# Patient Record
Sex: Female | Born: 1935 | Race: White | Hispanic: No | Smoking: Never smoker
Health system: Southern US, Community
[De-identification: ages and names within clinical notes are randomized; demographics above are authoritative.]

## PROBLEM LIST (undated history)

## (undated) DIAGNOSIS — M1990 Unspecified osteoarthritis, unspecified site: Secondary | ICD-10-CM

## (undated) DIAGNOSIS — E119 Type 2 diabetes mellitus without complications: Secondary | ICD-10-CM

## (undated) DIAGNOSIS — K219 Gastro-esophageal reflux disease without esophagitis: Secondary | ICD-10-CM

## (undated) DIAGNOSIS — F419 Anxiety disorder, unspecified: Secondary | ICD-10-CM

## (undated) DIAGNOSIS — R011 Cardiac murmur, unspecified: Secondary | ICD-10-CM

## (undated) DIAGNOSIS — G629 Polyneuropathy, unspecified: Secondary | ICD-10-CM

## (undated) DIAGNOSIS — I10 Essential (primary) hypertension: Secondary | ICD-10-CM

## (undated) HISTORY — PX: CHOLECYSTECTOMY: SHX55

## (undated) HISTORY — PX: ABDOMINAL HYSTERECTOMY: SHX81

## (undated) HISTORY — PX: COLONOSCOPY WITH ESOPHAGOGASTRODUODENOSCOPY (EGD): SHX5779

## (undated) HISTORY — PX: SHOULDER ARTHROSCOPY: SHX128

## (undated) HISTORY — PX: CARPAL TUNNEL RELEASE: SHX101

---

## 2012-02-22 ENCOUNTER — Ambulatory Visit: Payer: Self-pay

## 2012-02-22 LAB — URINALYSIS, COMPLETE
Blood: NEGATIVE
Glucose,UR: NEGATIVE mg/dL (ref 0–75)
Ketone: NEGATIVE
Protein: NEGATIVE
RBC,UR: NONE SEEN /HPF (ref 0–5)
Specific Gravity: 1.005 (ref 1.003–1.030)

## 2013-02-16 ENCOUNTER — Emergency Department: Payer: Self-pay | Admitting: Emergency Medicine

## 2013-02-16 LAB — CBC
HGB: 14.4 g/dL (ref 12.0–16.0)
MCH: 30.2 pg (ref 26.0–34.0)
MCHC: 34.4 g/dL (ref 32.0–36.0)
MCV: 88 fL (ref 80–100)
Platelet: 200 10*3/uL (ref 150–440)
RDW: 12.8 % (ref 11.5–14.5)

## 2013-02-16 LAB — COMPREHENSIVE METABOLIC PANEL
Alkaline Phosphatase: 67 U/L (ref 50–136)
Anion Gap: 7 (ref 7–16)
BUN: 17 mg/dL (ref 7–18)
Bilirubin,Total: 0.8 mg/dL (ref 0.2–1.0)
Calcium, Total: 9.2 mg/dL (ref 8.5–10.1)
Chloride: 102 mmol/L (ref 98–107)
Co2: 25 mmol/L (ref 21–32)
Creatinine: 0.7 mg/dL (ref 0.60–1.30)
EGFR (Non-African Amer.): >60
Osmolality: 271 (ref 275–301)
Potassium: 4 mmol/L (ref 3.5–5.1)
Sodium: 134 mmol/L — ABNORMAL LOW (ref 136–145)

## 2013-02-16 LAB — CK TOTAL AND CKMB (NOT AT ARMC): CK-MB: 0.8 ng/mL (ref 0.5–3.6)

## 2013-02-16 LAB — TROPONIN I: Troponin-I: <0.02 ng/mL

## 2013-02-18 ENCOUNTER — Emergency Department: Payer: Self-pay | Admitting: Unknown Physician Specialty

## 2013-02-18 LAB — URINALYSIS, COMPLETE
Bilirubin,UR: NEGATIVE
Blood: NEGATIVE
Glucose,UR: 50 mg/dL (ref 0–75)
Ketone: NEGATIVE
Nitrite: NEGATIVE
Ph: 5 (ref 4.5–8.0)
Protein: NEGATIVE
RBC,UR: 1 /HPF (ref 0–5)
Specific Gravity: 1.012 (ref 1.003–1.030)
Squamous Epithelial: 6
WBC UR: 3 /HPF (ref 0–5)

## 2013-02-18 LAB — CBC
HGB: 14.9 g/dL (ref 12.0–16.0)
MCH: 29.6 pg (ref 26.0–34.0)
Platelet: 218 10*3/uL (ref 150–440)
RBC: 5.03 10*6/uL (ref 3.80–5.20)
RDW: 12.6 % (ref 11.5–14.5)
WBC: 7.5 10*3/uL (ref 3.6–11.0)

## 2013-02-18 LAB — COMPREHENSIVE METABOLIC PANEL
Anion Gap: 6 — ABNORMAL LOW (ref 7–16)
Chloride: 99 mmol/L (ref 98–107)
Co2: 28 mmol/L (ref 21–32)
Creatinine: 0.97 mg/dL (ref 0.60–1.30)
EGFR (Non-African Amer.): 57 — ABNORMAL LOW
Osmolality: 275 (ref 275–301)
SGPT (ALT): 45 U/L (ref 12–78)
Sodium: 133 mmol/L — ABNORMAL LOW (ref 136–145)

## 2013-02-22 ENCOUNTER — Ambulatory Visit: Payer: Self-pay | Admitting: Unknown Physician Specialty

## 2013-02-24 LAB — PATHOLOGY REPORT

## 2013-03-02 ENCOUNTER — Ambulatory Visit: Payer: Self-pay | Admitting: Family Medicine

## 2013-05-07 ENCOUNTER — Ambulatory Visit: Payer: Self-pay | Admitting: Family Medicine

## 2014-02-09 ENCOUNTER — Ambulatory Visit: Payer: Self-pay | Admitting: Family Medicine

## 2014-03-25 ENCOUNTER — Ambulatory Visit: Payer: Self-pay | Admitting: Family Medicine

## 2014-03-26 ENCOUNTER — Ambulatory Visit: Payer: Self-pay

## 2014-03-26 ENCOUNTER — Ambulatory Visit: Payer: Self-pay | Admitting: Physician Assistant

## 2014-06-10 ENCOUNTER — Ambulatory Visit: Payer: Self-pay | Admitting: Unknown Physician Specialty

## 2014-06-13 LAB — PATHOLOGY REPORT

## 2014-08-22 ENCOUNTER — Ambulatory Visit: Payer: Self-pay | Admitting: Physician Assistant

## 2014-08-22 LAB — URINALYSIS, COMPLETE
Bilirubin,UR: NEGATIVE
GLUCOSE, UR: NEGATIVE
Ketone: NEGATIVE
Nitrite: NEGATIVE
Ph: 6.5 (ref 5.0–8.0)
Protein: NEGATIVE
SQUAMOUS EPITHELIAL: NONE SEEN
Specific Gravity: 1.01 (ref 1.000–1.030)
WBC UR: >30 /HPF (ref 0–5)

## 2014-08-23 LAB — URINE CULTURE

## 2014-11-04 HISTORY — PX: OTHER SURGICAL HISTORY: SHX169

## 2015-02-23 ENCOUNTER — Other Ambulatory Visit: Payer: Self-pay | Admitting: Surgery

## 2015-02-23 DIAGNOSIS — M7581 Other shoulder lesions, right shoulder: Secondary | ICD-10-CM

## 2015-02-23 DIAGNOSIS — M7541 Impingement syndrome of right shoulder: Secondary | ICD-10-CM

## 2015-03-06 ENCOUNTER — Ambulatory Visit
Admission: RE | Admit: 2015-03-06 | Discharge: 2015-03-06 | Disposition: A | Discharge status: Home / Self Care | Payer: Medicare Other | Source: Ambulatory Visit | Attending: Surgery | Admitting: Surgery

## 2015-03-06 DIAGNOSIS — M7581 Other shoulder lesions, right shoulder: Secondary | ICD-10-CM

## 2015-03-06 DIAGNOSIS — M7591 Shoulder lesion, unspecified, right shoulder: Secondary | ICD-10-CM | POA: Insufficient documentation

## 2015-03-06 DIAGNOSIS — M7551 Bursitis of right shoulder: Secondary | ICD-10-CM | POA: Insufficient documentation

## 2015-03-06 DIAGNOSIS — S46011A Strain of muscle(s) and tendon(s) of the rotator cuff of right shoulder, initial encounter: Secondary | ICD-10-CM | POA: Insufficient documentation

## 2015-03-06 DIAGNOSIS — M7541 Impingement syndrome of right shoulder: Secondary | ICD-10-CM

## 2015-03-29 ENCOUNTER — Encounter: Payer: Self-pay | Admitting: *Deleted

## 2015-03-29 ENCOUNTER — Ambulatory Visit: Payer: Medicare Other

## 2015-03-29 DIAGNOSIS — F419 Anxiety disorder, unspecified: Secondary | ICD-10-CM | POA: Diagnosis not present

## 2015-03-29 DIAGNOSIS — K219 Gastro-esophageal reflux disease without esophagitis: Secondary | ICD-10-CM | POA: Diagnosis not present

## 2015-03-29 DIAGNOSIS — M7541 Impingement syndrome of right shoulder: Secondary | ICD-10-CM | POA: Diagnosis not present

## 2015-03-29 DIAGNOSIS — Z7982 Long term (current) use of aspirin: Secondary | ICD-10-CM | POA: Diagnosis not present

## 2015-03-29 DIAGNOSIS — Z79899 Other long term (current) drug therapy: Secondary | ICD-10-CM | POA: Diagnosis not present

## 2015-03-29 DIAGNOSIS — I10 Essential (primary) hypertension: Secondary | ICD-10-CM | POA: Diagnosis not present

## 2015-03-29 DIAGNOSIS — M75111 Incomplete rotator cuff tear or rupture of right shoulder, not specified as traumatic: Secondary | ICD-10-CM | POA: Diagnosis not present

## 2015-03-29 DIAGNOSIS — E119 Type 2 diabetes mellitus without complications: Secondary | ICD-10-CM | POA: Diagnosis not present

## 2015-03-29 DIAGNOSIS — E785 Hyperlipidemia, unspecified: Secondary | ICD-10-CM | POA: Diagnosis not present

## 2015-03-29 NOTE — Patient Instructions (Signed)
  Your procedure is scheduled on: 04-06-15 Report to [facility] To find out your arrival time please call [phone] between 1PM - 3PM on 04-05-15 [facility](WEDNESDAY)  Remember: Instructions that are not followed completely may result in serious medical risk, up to and including death, or upon the discretion of your surgeon and anesthesiologist your surgery may need to be rescheduled.    __X__ 1. Do not eat food or drink liquids after midnight. No gum chewing or hard candies.     __X__ 2. No Alcohol for 24 hours before or after surgery.   ____ 3. Bring all medications with you on the day of surgery if instructed.    __X__ 4. Notify your doctor if there is any change in your medical condition     (cold, fever, infections).     Do not wear jewelry, make-up, hairpins, clips or nail polish.  Do not wear lotions, powders, or perfumes. You may wear deodorant.  Do not shave 48 hours prior to surgery. Men may shave face and neck.  Do not bring valuables to the hospital.    [facility] is not responsible for any belongings or valuables.               Contacts, dentures or bridgework may not be worn into surgery.  Leave your suitcase in the car. After surgery it may be brought to your room.  For patients admitted to the hospital, discharge time is determined by your treatment team.   Patients discharged the day of surgery will not be allowed to drive home.   Please read over the following fact sheets that you were given:     __X__ Take these medicines the morning of surgery with A SIP OF WATER:    1. LOSARTAN  2. OMEPRAZOLE  3. LOVASTATIN  4. GABAPENTIN  5.  6.  ____ Fleet Enema (as directed)   __X__ Use CHG Soap as directed  ____ Use inhalers on the day of surgery  ____ Stop metformin 2 days prior to surgery    ____ Take 1/2 of usual insulin dose the night before surgery and none on the morning of surgery.   __X__ Stop Coumadin/Plavix/aspirin-STOP ASPIRIN  NOW  ____ Stop Anti-inflammatories-NO NSAIDS OR ASPIRIN PRODUCTS-TYLENOL OK   __X__ Stop supplements until after surgery. (STOP OMEGA 3 KRILL OIL NOW)  ____ Bring C-Pap to the hospital.

## 2015-03-30 ENCOUNTER — Encounter
Admission: RE | Admit: 2015-03-30 | Discharge: 2015-03-30 | Disposition: A | Discharge status: Home / Self Care | Payer: Medicare Other | Source: Ambulatory Visit | Attending: Surgery | Admitting: Surgery

## 2015-03-30 ENCOUNTER — Inpatient Hospital Stay: Admission: RE | Admit: 2015-03-30 | Payer: Medicare Other | Source: Ambulatory Visit

## 2015-03-30 DIAGNOSIS — M75101 Unspecified rotator cuff tear or rupture of right shoulder, not specified as traumatic: Secondary | ICD-10-CM | POA: Diagnosis not present

## 2015-03-30 DIAGNOSIS — Z01810 Encounter for preprocedural cardiovascular examination: Secondary | ICD-10-CM | POA: Insufficient documentation

## 2015-03-30 DIAGNOSIS — Z01812 Encounter for preprocedural laboratory examination: Secondary | ICD-10-CM | POA: Insufficient documentation

## 2015-03-30 LAB — BASIC METABOLIC PANEL
ANION GAP: 6 (ref 5–15)
BUN: 13 mg/dL (ref 6–20)
CALCIUM: 9.3 mg/dL (ref 8.9–10.3)
CHLORIDE: 107 mmol/L (ref 101–111)
CO2: 27 mmol/L (ref 22–32)
CREATININE: 0.88 mg/dL (ref 0.44–1.00)
GFR calc non Af Amer: >60 mL/min (ref >60)
GLUCOSE: 118 mg/dL — AB (ref 65–99)
POTASSIUM: 3.8 mmol/L (ref 3.5–5.1)
SODIUM: 140 mmol/L (ref 135–145)

## 2015-04-06 ENCOUNTER — Encounter: Payer: Self-pay | Admitting: Anesthesiology

## 2015-04-06 ENCOUNTER — Ambulatory Visit: Payer: Medicare Other | Admitting: Anesthesiology

## 2015-04-06 ENCOUNTER — Encounter
Admission: RE | Disposition: A | Discharge status: Home / Self Care | Payer: Self-pay | Source: Ambulatory Visit | Attending: Surgery

## 2015-04-06 ENCOUNTER — Ambulatory Visit
Admission: RE | Admit: 2015-04-06 | Discharge: 2015-04-06 | Disposition: A | Discharge status: Home / Self Care | Payer: Medicare Other | Source: Ambulatory Visit | Attending: Surgery | Admitting: Surgery

## 2015-04-06 DIAGNOSIS — F419 Anxiety disorder, unspecified: Secondary | ICD-10-CM | POA: Insufficient documentation

## 2015-04-06 DIAGNOSIS — Z7982 Long term (current) use of aspirin: Secondary | ICD-10-CM | POA: Insufficient documentation

## 2015-04-06 DIAGNOSIS — M75111 Incomplete rotator cuff tear or rupture of right shoulder, not specified as traumatic: Secondary | ICD-10-CM | POA: Insufficient documentation

## 2015-04-06 DIAGNOSIS — I10 Essential (primary) hypertension: Secondary | ICD-10-CM | POA: Insufficient documentation

## 2015-04-06 DIAGNOSIS — K219 Gastro-esophageal reflux disease without esophagitis: Secondary | ICD-10-CM | POA: Insufficient documentation

## 2015-04-06 DIAGNOSIS — E785 Hyperlipidemia, unspecified: Secondary | ICD-10-CM | POA: Insufficient documentation

## 2015-04-06 DIAGNOSIS — Z79899 Other long term (current) drug therapy: Secondary | ICD-10-CM | POA: Insufficient documentation

## 2015-04-06 DIAGNOSIS — M7541 Impingement syndrome of right shoulder: Secondary | ICD-10-CM | POA: Diagnosis not present

## 2015-04-06 DIAGNOSIS — E119 Type 2 diabetes mellitus without complications: Secondary | ICD-10-CM | POA: Insufficient documentation

## 2015-04-06 HISTORY — PX: SHOULDER ARTHROSCOPY: SHX128

## 2015-04-06 HISTORY — DX: Unspecified osteoarthritis, unspecified site: M19.90

## 2015-04-06 HISTORY — DX: Type 2 diabetes mellitus without complications: E11.9

## 2015-04-06 HISTORY — DX: Gastro-esophageal reflux disease without esophagitis: K21.9

## 2015-04-06 HISTORY — DX: Essential (primary) hypertension: I10

## 2015-04-06 HISTORY — DX: Polyneuropathy, unspecified: G62.9

## 2015-04-06 LAB — GLUCOSE, CAPILLARY: Glucose-Capillary: 140 mg/dL — ABNORMAL HIGH (ref 65–99)

## 2015-04-06 SURGERY — ARTHROSCOPY, SHOULDER
Anesthesia: General | Site: Shoulder | Laterality: Right | Wound class: Clean

## 2015-04-06 MED ORDER — ONDANSETRON HCL 4 MG/2ML IJ SOLN: 4.0000 mg | Freq: Once | INTRAMUSCULAR | Status: DC | PRN

## 2015-04-06 MED ORDER — NEOSTIGMINE METHYLSULFATE 10 MG/10ML IV SOLN
INTRAVENOUS | Status: DC | PRN
  Administered 2015-04-06: 2.5 mg via INTRAVENOUS

## 2015-04-06 MED ORDER — SODIUM CHLORIDE 0.9 % IV SOLN
Freq: Once | INTRAVENOUS | Status: AC
  Administered 2015-04-06: 10:00:00 via INTRAVENOUS

## 2015-04-06 MED ORDER — ONDANSETRON HCL 4 MG/2ML IJ SOLN: 4.0000 mg | Freq: Four times a day (QID) | INTRAMUSCULAR | Status: DC | PRN

## 2015-04-06 MED ORDER — METOCLOPRAMIDE HCL 10 MG PO TABS: 5.0000 mg | ORAL_TABLET | Freq: Three times a day (TID) | ORAL | Status: DC | PRN

## 2015-04-06 MED ORDER — ROCURONIUM BROMIDE 100 MG/10ML IV SOLN
INTRAVENOUS | Status: DC | PRN
  Administered 2015-04-06: 30 mg via INTRAVENOUS

## 2015-04-06 MED ORDER — OXYCODONE HCL 5 MG PO TABS: 5.0000 mg | ORAL_TABLET | ORAL | Status: DC | PRN

## 2015-04-06 MED ORDER — FENTANYL CITRATE (PF) 100 MCG/2ML IJ SOLN: 25.0000 ug | INTRAMUSCULAR | Status: DC | PRN

## 2015-04-06 MED ORDER — PHENYLEPHRINE HCL 10 MG/ML IJ SOLN
INTRAMUSCULAR | Status: DC | PRN
  Administered 2015-04-06 (×5): 100 ug via INTRAVENOUS

## 2015-04-06 MED ORDER — POTASSIUM CHLORIDE IN NACL 20-0.9 MEQ/L-% IV SOLN
INTRAVENOUS | Status: DC
  Filled 2015-04-06 (×3): qty 1000

## 2015-04-06 MED ORDER — MIDAZOLAM HCL 5 MG/5ML IJ SOLN
INTRAMUSCULAR | Status: AC
  Administered 2015-04-06: 1 mg via INTRAVENOUS
  Filled 2015-04-06: qty 5

## 2015-04-06 MED ORDER — LIDOCAINE HCL (CARDIAC) 20 MG/ML IV SOLN
INTRAVENOUS | Status: DC | PRN
  Administered 2015-04-06: 50 mg via INTRAVENOUS

## 2015-04-06 MED ORDER — FENTANYL CITRATE (PF) 100 MCG/2ML IJ SOLN
50.0000 ug | Freq: Once | INTRAMUSCULAR | Status: AC
  Administered 2015-04-06: 50 ug via INTRAVENOUS

## 2015-04-06 MED ORDER — ONDANSETRON HCL 4 MG PO TABS: 4.0000 mg | ORAL_TABLET | Freq: Four times a day (QID) | ORAL | Status: DC | PRN

## 2015-04-06 MED ORDER — BUPIVACAINE-EPINEPHRINE (PF) 0.5% -1:200000 IJ SOLN
INTRAMUSCULAR | Status: DC | PRN
  Administered 2015-04-06: 22 mL

## 2015-04-06 MED ORDER — DEXAMETHASONE SODIUM PHOSPHATE 4 MG/ML IJ SOLN
INTRAMUSCULAR | Status: DC | PRN
  Administered 2015-04-06: 5 mg via INTRAVENOUS

## 2015-04-06 MED ORDER — FENTANYL CITRATE (PF) 100 MCG/2ML IJ SOLN
INTRAMUSCULAR | Status: DC | PRN
  Administered 2015-04-06 (×2): 50 ug via INTRAVENOUS

## 2015-04-06 MED ORDER — ONDANSETRON HCL 4 MG/2ML IJ SOLN
INTRAMUSCULAR | Status: DC | PRN
  Administered 2015-04-06: 4 mg via INTRAVENOUS

## 2015-04-06 MED ORDER — GLYCOPYRROLATE 0.2 MG/ML IJ SOLN
INTRAMUSCULAR | Status: DC | PRN
  Administered 2015-04-06: .5 mg via INTRAVENOUS

## 2015-04-06 MED ORDER — METOCLOPRAMIDE HCL 5 MG/ML IJ SOLN: 5.0000 mg | Freq: Three times a day (TID) | INTRAMUSCULAR | Status: DC | PRN

## 2015-04-06 MED ORDER — EPHEDRINE SULFATE 50 MG/ML IJ SOLN
INTRAMUSCULAR | Status: DC | PRN
  Administered 2015-04-06 (×3): 10 mg via INTRAVENOUS

## 2015-04-06 MED ORDER — CEFAZOLIN SODIUM-DEXTROSE 2-3 GM-% IV SOLR
INTRAVENOUS | Status: AC
  Filled 2015-04-06: qty 50

## 2015-04-06 MED ORDER — MIDAZOLAM HCL 5 MG/5ML IJ SOLN
1.0000 mg | Freq: Once | INTRAMUSCULAR | Status: AC
  Administered 2015-04-06: 1 mg via INTRAVENOUS

## 2015-04-06 MED ORDER — PROPOFOL 10 MG/ML IV BOLUS
INTRAVENOUS | Status: DC | PRN
  Administered 2015-04-06: 100 mg via INTRAVENOUS

## 2015-04-06 MED ORDER — CEFAZOLIN SODIUM-DEXTROSE 2-3 GM-% IV SOLR
2.0000 g | Freq: Once | INTRAVENOUS | Status: AC
  Administered 2015-04-06: 2 g via INTRAVENOUS

## 2015-04-06 MED ORDER — EPINEPHRINE HCL 1 MG/ML IJ SOLN
INTRAMUSCULAR | Status: AC
  Filled 2015-04-06: qty 1

## 2015-04-06 MED ORDER — FENTANYL CITRATE (PF) 100 MCG/2ML IJ SOLN
INTRAMUSCULAR | Status: AC
  Administered 2015-04-06: 50 ug via INTRAVENOUS
  Filled 2015-04-06: qty 2

## 2015-04-06 MED ORDER — SODIUM CHLORIDE 0.9 % IV SOLN
INTRAVENOUS | Status: DC | PRN
  Administered 2015-04-06: 11:00:00 via INTRAVENOUS

## 2015-04-06 MED ORDER — BUPIVACAINE-EPINEPHRINE (PF) 0.5% -1:200000 IJ SOLN
INTRAMUSCULAR | Status: AC
  Filled 2015-04-06: qty 30

## 2015-04-06 SURGICAL SUPPLY — 47 items
ANCHOR JUGGERKNOT WTAP NDL 2.9 (Anchor) ×3 IMPLANT
BIT DRILL JUGRKNT W/NDL BIT2.9 (DRILL) ×1 IMPLANT
BLADE FULL RADIUS 3.5 (BLADE) ×3 IMPLANT
BLADE SHAVER 4.5X7 STR FR (MISCELLANEOUS) ×3 IMPLANT
BUR ACROMIONIZER 4.0 (BURR) ×3 IMPLANT
BUR BR 5.5 WIDE MOUTH (BURR) ×3 IMPLANT
CANNULA 8.5X75 THRED (CANNULA) ×3 IMPLANT
CANNULA SHAVER 8MMX76MM (CANNULA) ×3 IMPLANT
CHLORAPREP W/TINT 26ML (MISCELLANEOUS) ×6 IMPLANT
DRAPE IMP U-DRAPE 54X76 (DRAPES) ×6 IMPLANT
DRAPE SURG 17X11 SM STRL (DRAPES) ×3 IMPLANT
DRILL JUGGERKNOT W/NDL BIT 2.9 (DRILL) ×3
GAUZE PETRO XEROFOAM 1X8 (MISCELLANEOUS) ×3 IMPLANT
GAUZE SPONGE 4X4 12PLY STRL (GAUZE/BANDAGES/DRESSINGS) ×3 IMPLANT
GLOVE BIO SURGEON STRL SZ8 (GLOVE) ×6 IMPLANT
GLOVE INDICATOR 8.0 STRL GRN (GLOVE) ×3 IMPLANT
GOWN STRL REUS W/ TWL LRG LVL3 (GOWN DISPOSABLE) ×1 IMPLANT
GOWN STRL REUS W/ TWL XL LVL3 (GOWN DISPOSABLE) ×1 IMPLANT
GOWN STRL REUS W/TWL LRG LVL3 (GOWN DISPOSABLE) ×2
GOWN STRL REUS W/TWL XL LVL3 (GOWN DISPOSABLE) ×2
GRASPER SUT 15 45D LOW PRO (SUTURE) ×6 IMPLANT
IV LACTATED RINGER IRRG 3000ML (IV SOLUTION) ×4
IV LR IRRIG 3000ML ARTHROMATIC (IV SOLUTION) ×2 IMPLANT
MANIFOLD NEPTUNE II (INSTRUMENTS) ×3 IMPLANT
MASK FACE SPIDER DISP (MASK) ×3 IMPLANT
MAT BLUE FLOOR 46X72 FLO (MISCELLANEOUS) ×3 IMPLANT
NDL MAYO CATGUT SZ4 (NEEDLE) ×3 IMPLANT
NEEDLE MAYO 6 CRC TAPER PT (NEEDLE) ×3 IMPLANT
NEEDLE MAYO CATGUT SZ 1.5 (NEEDLE) ×3
NEEDLE MAYO CATGUT SZ 2 (NEEDLE) ×1 IMPLANT
NEEDLE REVERSE CUT 1/2 CRC (NEEDLE) ×3 IMPLANT
PACK ARTHROSCOPY SHOULDER (MISCELLANEOUS) ×3 IMPLANT
PAD GROUND ADULT SPLIT (MISCELLANEOUS) ×3 IMPLANT
SLING ARM LRG DEEP (SOFTGOODS) IMPLANT
SLING ULTRA II LG (MISCELLANEOUS) ×3 IMPLANT
STAPLER SKIN PROX 35W (STAPLE) ×3 IMPLANT
STRAP SAFETY BODY (MISCELLANEOUS) ×3 IMPLANT
SUT ETHIBOND 0 MO6 C/R (SUTURE) ×3 IMPLANT
SUT PROLENE 4 0 PS 2 18 (SUTURE) ×3 IMPLANT
SUT VIC AB 2-0 CT1 27 (SUTURE) ×4
SUT VIC AB 2-0 CT1 TAPERPNT 27 (SUTURE) ×2 IMPLANT
TAPE MICROFOAM 4IN (TAPE) ×3 IMPLANT
TUBING ARTHRO INFLOW-ONLY STRL (TUBING) ×3 IMPLANT
TUBING CONNECTING 10 (TUBING) ×2 IMPLANT
TUBING CONNECTING 10' (TUBING) ×1
WAND HAND CNTRL MULTIVAC 90 (MISCELLANEOUS) ×3 IMPLANT
juggerknot soft anchor ×2 IMPLANT

## 2015-04-06 NOTE — Transfer of Care (Signed)
Immediate Anesthesia Transfer of Care Note  Patient: Dawn Farmer  Procedure(s) Performed: Procedure(s): right shoulder arthroscopy, arthroscopic labral debridement, subacromial decompression, mini open rotator cuff repair (Right)  Patient Location: PACU  Anesthesia Type:General  Level of Consciousness: sedated and responds to stimulation  Airway & Oxygen Therapy: Patient Spontanous Breathing and Patient connected to face mask oxygen  Post-op Assessment: Report given to RN  Post vital signs: Reviewed  Last Vitals:  Filed Vitals:   04/06/15 1314  BP: 136/64  Pulse: 90  Temp: 36 C  Resp: 16    Complications: No apparent anesthesia complications

## 2015-04-06 NOTE — Anesthesia Procedure Notes (Addendum)
Anesthesia Regional Block:  Interscalene brachial plexus block  Pre-Anesthetic Checklist: ,, timeout performed, Correct Patient, Correct Site, Correct Laterality, Correct Procedure, Correct Position, site marked, Risks and benefits discussed,  Surgical consent,  Pre-op evaluation,  At surgeon's request and post-op pain management   Prep: Betadine       Needles:  Injection technique: Single-shot  Needle Type: Echogenic Stimulator Needle     Needle Length: 5cm 5 cm Needle Gauge: 21 and 21 G    Additional Needles:  Procedures: ultrasound guided (picture in chart) and nerve stimulator Interscalene brachial plexus block  Nerve Stimulator or Paresthesia:  Response: biceps flexion, 0.8 mA,   Additional Responses:   Narrative:  Injection made incrementally with aspirations every 5 mL.  Performed by: Personally  Anesthesiologist: Berdine AddisonHOMAS, MATHAI  Additional Notes: Functioning IV was confirmed and monitors were applied.  A 50mm 22ga Arrow echogenic stimulator needle was used. Sterile prep and drape,hand hygiene and sterile gloves were used.  Negative aspiration and negative test dose prior to incremental administration of local anesthetic. The patient tolerated the procedure well.  Ultrasound guidance: relevent anatomy identified, needle position confirmed, local anesthetic spread visualized around nerve(s), vascular puncture avoided.  Image printed for medical record.    Procedure Name: Intubation Date/Time: 04/06/2015 11:35 AM Performed by: Irving BurtonBACHICH, Elverda Wendel Pre-anesthesia Checklist: Patient identified, Emergency Drugs available, Suction available and Patient being monitored Patient Re-evaluated:Patient Re-evaluated prior to inductionOxygen Delivery Method: Circle system utilized Preoxygenation: Pre-oxygenation with 100% oxygen Intubation Type: IV induction Ventilation: Mask ventilation without difficulty Grade View: Grade II Tube type: Oral Tube size: 7.0 mm Number of  attempts: 1 Airway Equipment and Method: Stylet Placement Confirmation: ETT inserted through vocal cords under direct vision,  positive ETCO2 and breath sounds checked- equal and bilateral Secured at: 21 cm Tube secured with: Tape Dental Injury: Teeth and Oropharynx as per pre-operative assessment    20 ml of 0.25 marcaine and 0.1 mg epi.

## 2015-04-06 NOTE — Anesthesia Preprocedure Evaluation (Addendum)
Anesthesia Evaluation  Patient identified by MRN, date of birth, ID band Patient awake    Airway Mallampati: II  TM Distance: >3 FB     Dental   Pulmonary          Cardiovascular hypertension,     Neuro/Psych  Neuromuscular disease    GI/Hepatic GERD-  ,  Endo/Other  diabetes, Well Controlled, Type 2  Renal/GU      Musculoskeletal  (+) Arthritis -,   Abdominal   Peds  Hematology   Anesthesia Other Findings Hx of IBS.hx of ulcers. Overbite.  Reproductive/Obstetrics                            Anesthesia Physical Anesthesia Plan  ASA: III  Anesthesia Plan: General   Post-op Pain Management: MAC Combined w/ Regional for Post-op pain   Induction: Intravenous  Airway Management Planned: Oral ETT  Additional Equipment:   Intra-op Plan:   Post-operative Plan:   Informed Consent: I have reviewed the patients History and Physical, chart, labs and discussed the procedure including the risks, benefits and alternatives for the proposed anesthesia with the patient or authorized representative who has indicated his/her understanding and acceptance.     Plan Discussed with: CRNA  Anesthesia Plan Comments:         Anesthesia Quick Evaluation

## 2015-04-06 NOTE — H&P (Signed)
Paper H&P to be scanned into permanent record. H&P reviewed. No changes. 

## 2015-04-06 NOTE — Op Note (Signed)
04/06/2015  1:16 PM  Patient:   Dawn Farmer  Pre-Op Diagnosis:   Impingement/tendinopathy with partial-thickness rotator cuff tear, right shoulder.   Postoperative diagnosis: Impingement/tendinopathy with near full-thickness rotator cuff tear and labral fraying, right shoulder.  Procedure: Limited arthroscopic debridement, arthroscopic subacromial decompression, and mini-open rotator cuff repair, right shoulder.  Anesthesia: General endotracheal with interscalene block placed preoperatively by the anesthesiologist.  Findings: As above. There were grade 1 chondral malacia changes involving the central glenoid and humeral head. The biceps tendon was in excellent condition. There was no labral detachment. There was a focal tear of the anterior insertional fibers of the supraspinatus. The remaining portions of the rotator cuff all were in satisfactory condition.  Complications: None  Fluids:   700 cc  Estimated blood loss: 5 cc  Tourniquet time: None  Drains: None  Closure: Staples   Brief clinical note: The patient is a 80 year old female with a history of gradually worsening right shoulder pain. The patient's symptoms have progressed despite medications, activity modification, etc. The patient's history and examination are consistent with impingement/tendinopathy and a rotator cuff tear. These findings, including an at least partial thickness rotator cuff tear, were confirmed by MRI scan. The patient presents at this time for definitive management of these shoulder symptoms.  Procedure: The patient was brought into the operating room and lain in the supine position. After adequate IV sedation was achieved, the patient underwent placement of an interscalene block by the anesthesiologist. The patient then underwent general endotracheal intubation and anesthesia before being repositioned in the beach chair position using the beach chair position. The right  shoulder and upper extremity were prepped with ChloraPrep solution before being draped sterilely. Preoperative antibiotics were administered. A timeout was performed to confirm the proper side was prepped before the expected portal sites and incision site were injected with 0.5% Sensorcaine with epinephrine. A posterior portal was created and the glenohumeral joint thoroughly inspected with the findings as described above. An anterior portal was created using an outside-in technique. The labrum and rotator cuff were further probed, again confirming the above-noted findings. The areas of labral fraying postero-superiorly and anterosuperiorly were debrided back to stable margins using the full-radius resector. Some areas of synovitis also were debrided using the full-radius resector The ArthroCare wand was inserted and used to obtain hemostasis as well as to "anneal" the labrum superiorly and anteriorly. The instruments were removed from the joint after suctioning the excess fluid.  The camera was repositioned through the posterior portal into the subacromial space. A separate lateral portal was created using an outside-in technique. The 3.5 full-radius resector was introduced and used to perform a subtotal bursectomy. The ArthroCare wand was then inserted and used to remove the periosteal tissue off the undersurface of the anterior third of the acromion as well as to recess the coracoacromial ligament from its attachment along the anterior and lateral margins of the acromion. The 4.0 mm acromionizing bur was introduced and used to complete the decompression by removing the undersurface of the anterior third of the acromion. The full radius resector was reintroduced to remove any residual bony debris before the ArthroCare wand was reintroduced to obtain hemostasis. The instruments were then removed from the subacromial space after suctioning the excess fluid.  An approximately 4-5 cm incision was made over the  anterolateral aspect of the shoulder beginning at the anterolateral corner of the acromion and extending distally in line with the bicipital groove. This incision was carried down through the subcutaneous  tissues to expose the deltoid fascia. The raphae between the anterior and middle thirds was identified and this plane developed to provide access into the subacromial space. Additional bursal tissues were debrided sharply using Metzenbaum scissors. The rotator cuff tear was readily identified. The margins were debrided sharply with a #15 blade and the exposed greater tuberosity roughened with a rongeur. The tear was repaired using a single Biomet 2.9 mm JuggerKnot anchor. Several additional #0 Ethibond sutures were placed in a side-to-side fashion to complete the repair. An apparent watertight closure was obtained.  The wound was copiously irrigated with sterile saline solution before the deltoid raphae was reapproximated using 2-0 Vicryl interrupted sutures. The subcutaneous tissues were closed in two layers using 2-0 Vicryl interrupted sutures before the skin was closed using staples. The portal sites also were closed using staples. A sterile bulky dressing was applied to the shoulder before the arm was placed into a shoulder immobilizer. The patient was then awakened, extubated, and returned to the recovery room in satisfactory condition after tolerating the procedure well.

## 2015-04-06 NOTE — Anesthesia Postprocedure Evaluation (Signed)
  Anesthesia Post-op Note  Patient: Dawn Farmer  Procedure(s) Performed: Procedure(s): right shoulder arthroscopy, arthroscopic labral debridement, subacromial decompression, mini open rotator cuff repair (Right)  Anesthesia type:General  Patient location: PACU  Post pain: Pain level controlled  Post assessment: Post-op Vital signs reviewed, Patient's Cardiovascular Status Stable, Respiratory Function Stable, Patent Airway and No signs of Nausea or vomiting  Post vital signs: Reviewed and stable  Last Vitals:  Filed Vitals:   04/06/15 1452  BP: 142/60  Pulse: 94  Temp: 35.6 C  Resp: 18    Level of consciousness: awake, alert  and patient cooperative  Complications: No apparent anesthesia complications

## 2015-04-06 NOTE — Discharge Instructions (Addendum)
Keep dressing dry and intact.  May shower after dressing changed on post-op day #4 (Monday).  Cover staples/sutures with Band-Aids after drying off. Apply ice frequently to shoulder. Keep shoulder immobilizer on at all times except may remove for bathing purposes Follow-up April 17, 2015 at 9:45 am in [place] office with Dedra Skeensodd Mundy PA.        AMBULATORY SURGERY  DISCHARGE INSTRUCTIONS   1) The drugs that you were given will stay in your system until tomorrow so for the next 24 hours you should not:  A) Drive an automobile B) Make any legal decisions C) Drink any alcoholic beverage   2) You may resume regular meals tomorrow.  Today it is better to start with liquids and gradually work up to solid foods.  You may eat anything you prefer, but it is better to start with liquids, then soup and crackers, and gradually work up to solid foods.   3) Please notify your doctor immediately if you have any unusual bleeding, trouble breathing, redness and pain at the surgery site, drainage, fever, or pain not relieved by medication.

## 2015-04-15 NOTE — Addendum Note (Signed)
Addendum  created 04/15/15 0841 by Berdine Addison, MD   Modules edited: Anesthesia Events, Narrator   Narrator:  Narrator: Event Log Edited

## 2016-03-11 ENCOUNTER — Other Ambulatory Visit: Payer: Self-pay | Admitting: Family Medicine

## 2016-03-11 DIAGNOSIS — Z1231 Encounter for screening mammogram for malignant neoplasm of breast: Secondary | ICD-10-CM

## 2016-03-19 ENCOUNTER — Ambulatory Visit
Admission: RE | Admit: 2016-03-19 | Discharge: 2016-03-19 | Disposition: A | Discharge status: Home / Self Care | Payer: Medicare Other | Source: Ambulatory Visit | Attending: Family Medicine | Admitting: Family Medicine

## 2016-03-19 DIAGNOSIS — Z1231 Encounter for screening mammogram for malignant neoplasm of breast: Secondary | ICD-10-CM | POA: Insufficient documentation

## 2016-11-24 IMAGING — MR MR SHOULDER*R* W/O CM
6 series · 40 of 40 positions shown · non-contrast
Comparison: None.

CLINICAL DATA: Shoulder pain for 6 months. No known injury. Prior
surgery 3558. Limited range of motion.

EXAM:
MRI OF THE RIGHT SHOULDER WITHOUT CONTRAST
TECHNIQUE: Multiplanar, multisequence MR imaging of the shoulder was performed.
No intravenous contrast was administered.

[Series 4: T2 fat-sat · axial · 4.0mm · 0.47mm/px · z∈[-14,+74]mm · 6 of 21 slices shown (1 of 4)]
[im 1/21]
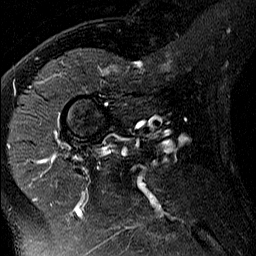
[im 5/21]
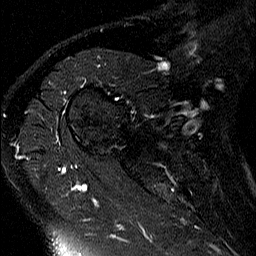
[im 9/21]
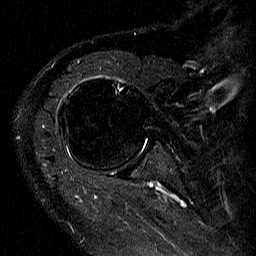
[im 13/21]
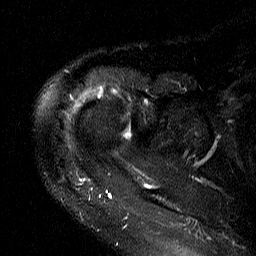
[im 17/21]
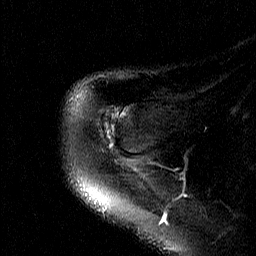
[im 21/21]
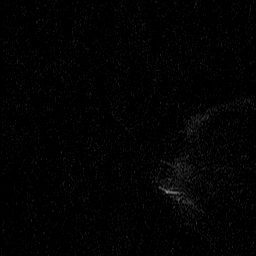

[Series 5: T2 fat-sat · oblique · 4.0mm · 0.62mm/px · 6 of 21 slices shown (2 of 4)]
[im 1/21]
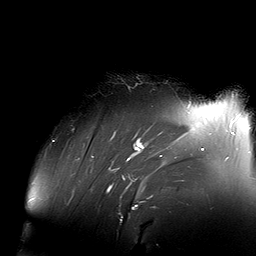
[im 5/21]
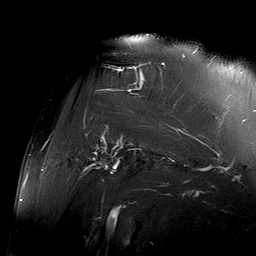
[im 9/21]
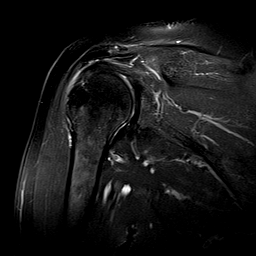
[im 13/21]
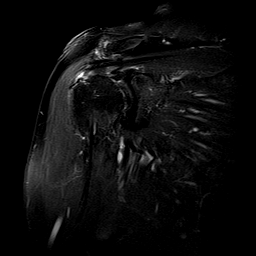
[im 17/21]
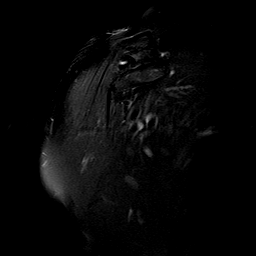
[im 21/21]
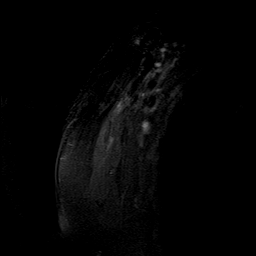

[Series 6: PD · oblique · 4.0mm · 0.62mm/px · 7 of 21 slices shown]
[im 1/21]
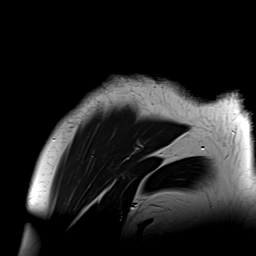
[im 4/21]
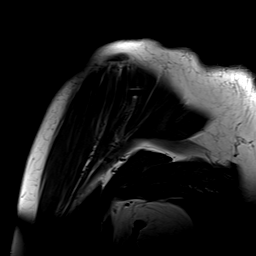
[im 7/21]
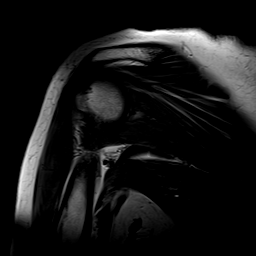
[im 11/21]
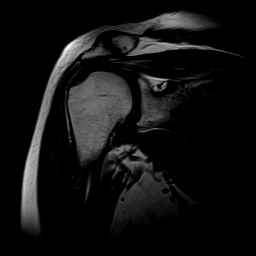
[im 14/21]
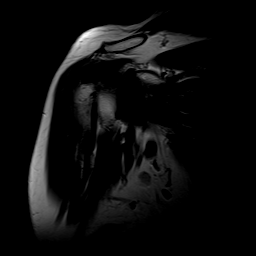
[im 17/21]
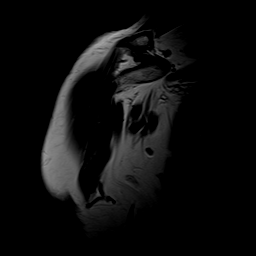
[im 21/21]
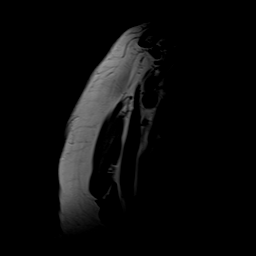

[Series 7: T1 · oblique · 4.0mm · 0.62mm/px · 7 of 21 slices shown]
[im 1/21]
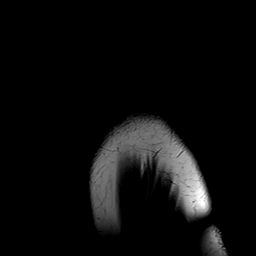
[im 4/21]
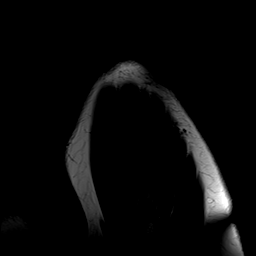
[im 7/21]
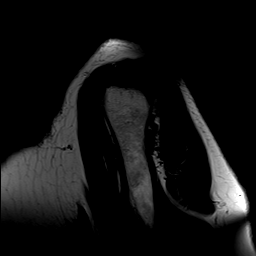
[im 11/21]
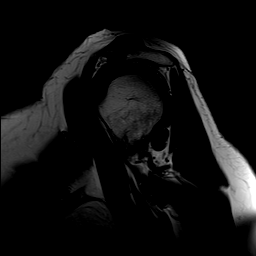
[im 14/21]
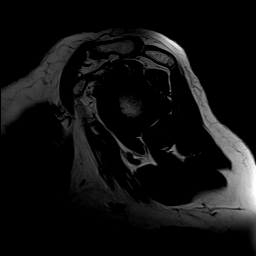
[im 17/21]
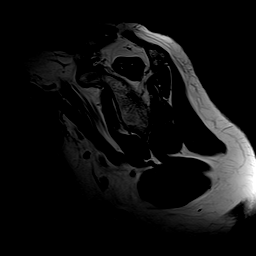
[im 21/21]
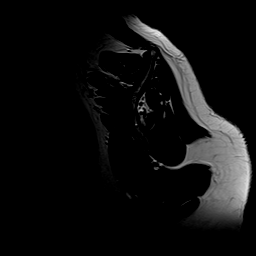

[Series 8: T2 fat-sat · oblique · 4.0mm · 0.62mm/px · 7 of 21 slices shown (3 of 4)]
[im 1/21]
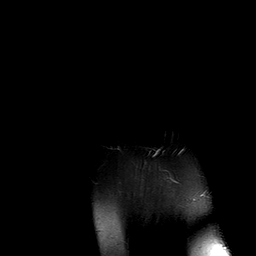
[im 4/21]
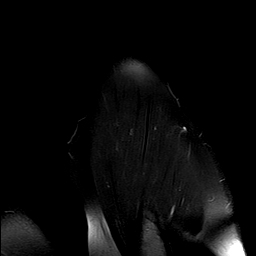
[im 7/21]
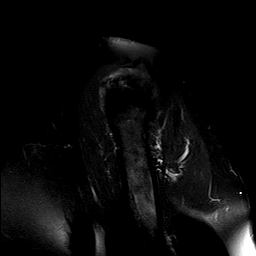
[im 11/21]
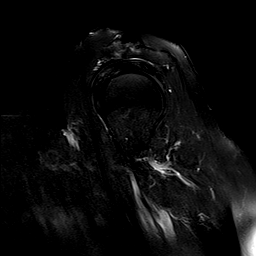
[im 14/21]
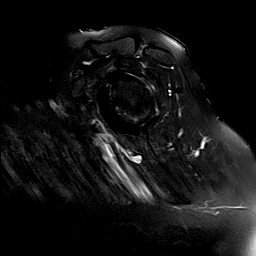
[im 17/21]
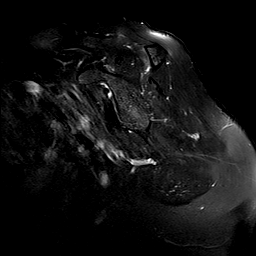
[im 21/21]
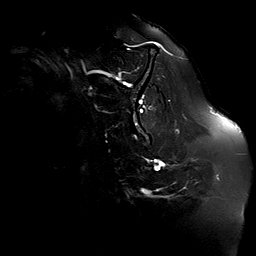

[Series 100: T2 fat-sat · oblique · 4.0mm · 0.62mm/px · 7 of 21 slices shown (4 of 4)]
[im 1/21]
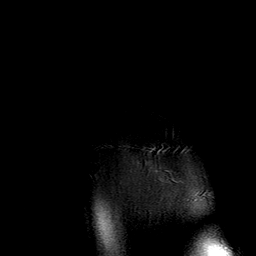
[im 4/21]
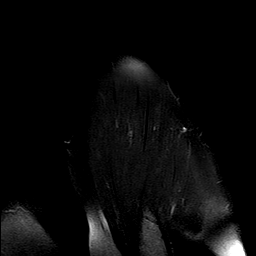
[im 7/21]
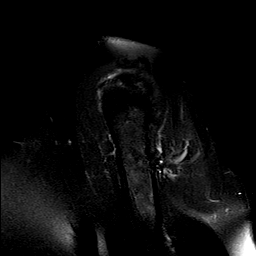
[im 11/21]
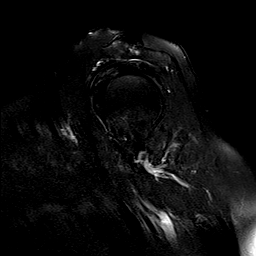
[im 14/21]
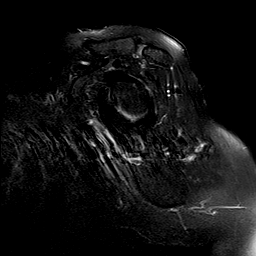
[im 17/21]
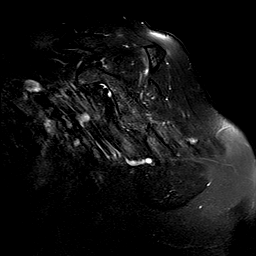
[im 21/21]
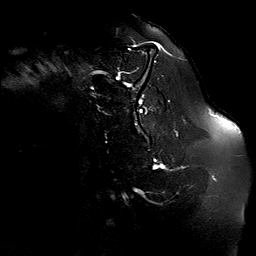

[40 of 40 positions shown; findings below may reference images not displayed]

FINDINGS: Rotator cuff: Mild tendinosis of the supraspinatus tendon with a
small high-grade partial thickness bursal surface tear of the
anterior-most fibers. Infraspinatus tendon is intact. Teres minor
tendon is intact. Subscapularis tendon is intact.

Muscles: No atrophy or fatty replacement of nor abnormal signal
within, the muscles of the rotator cuff.

Biceps long head:  Intact.

Acromioclavicular Joint: Mild degenerative changes of the
acromioclavicular joint. Type II acromion. Trace amount of
subacromial/subdeltoid bursal fluid.

Glenohumeral Joint: No joint effusion.  No chondral defect.

Labrum: Grossly intact, but evaluation is limited by lack of
intraarticular fluid.

Bones: No focal marrow signal abnormality. No fracture or
dislocation.
IMPRESSION: 1. Mild tendinosis of the supraspinatus tendon with a small
high-grade partial-thickness bursal surface tear of the
anterior-most fibers.
2. Mild subacromial/subdeltoid bursitis.

## 2017-04-25 ENCOUNTER — Other Ambulatory Visit: Payer: Self-pay | Admitting: Family Medicine

## 2017-04-25 DIAGNOSIS — Z1231 Encounter for screening mammogram for malignant neoplasm of breast: Secondary | ICD-10-CM

## 2017-04-30 ENCOUNTER — Ambulatory Visit
Admission: RE | Admit: 2017-04-30 | Discharge: 2017-04-30 | Disposition: A | Discharge status: Home / Self Care | Payer: Medicare Other | Source: Ambulatory Visit | Attending: Family Medicine | Admitting: Family Medicine

## 2017-04-30 ENCOUNTER — Other Ambulatory Visit: Payer: Self-pay | Admitting: Family Medicine

## 2017-04-30 DIAGNOSIS — Z1231 Encounter for screening mammogram for malignant neoplasm of breast: Secondary | ICD-10-CM | POA: Insufficient documentation

## 2018-06-04 ENCOUNTER — Ambulatory Visit
Admission: RE | Admit: 2018-06-04 | Discharge: 2018-06-04 | Disposition: A | Discharge status: Home / Self Care | Payer: Medicare Other | Source: Ambulatory Visit | Attending: Family Medicine | Admitting: Family Medicine

## 2018-06-04 ENCOUNTER — Other Ambulatory Visit: Payer: Self-pay | Admitting: Family Medicine

## 2018-06-04 DIAGNOSIS — R52 Pain, unspecified: Secondary | ICD-10-CM | POA: Diagnosis not present

## 2018-06-04 DIAGNOSIS — M19041 Primary osteoarthritis, right hand: Secondary | ICD-10-CM | POA: Diagnosis not present

## 2018-07-15 ENCOUNTER — Ambulatory Visit
Admission: EM | Admit: 2018-07-15 | Discharge: 2018-07-15 | Disposition: A | Discharge status: Home / Self Care | Payer: Medicare Other | Attending: Family Medicine | Admitting: Family Medicine

## 2018-07-15 ENCOUNTER — Encounter: Payer: Self-pay | Admitting: Emergency Medicine

## 2018-07-15 ENCOUNTER — Other Ambulatory Visit: Payer: Self-pay

## 2018-07-15 DIAGNOSIS — Z4802 Encounter for removal of sutures: Secondary | ICD-10-CM | POA: Diagnosis not present

## 2018-07-15 DIAGNOSIS — Z4889 Encounter for other specified surgical aftercare: Secondary | ICD-10-CM | POA: Diagnosis not present

## 2018-07-15 DIAGNOSIS — G5601 Carpal tunnel syndrome, right upper limb: Secondary | ICD-10-CM | POA: Diagnosis not present

## 2018-07-15 NOTE — ED Triage Notes (Signed)
Patient here for suture removal to her right hand. Patient stated she had carpal tunnel surgery on 07/01/18 and was told she could have the stitches removed on 07/15/18. Surgery was done in [place] but patient returned home and came here to have them removed.

## 2018-07-15 NOTE — ED Provider Notes (Signed)
[facility] ____________________________________________  Time seen: Approximately 8:27 AM  I have reviewed the triage vital signs and the nursing notes.   HISTORY  Chief Complaint Suture / Staple Removal   HPI ANALILIA RUDZIK is a 83 y.o. female presenting for evaluation of suture removal.  Patient reports 2 weeks ago she had right wrist carpal tunnel surgery with her previous Careers adviser in [place].  States she lives here.  States she was informed that she could go anywhere for suture removal which is why she is presenting today.  Patient states that she feels that her hand and wrist are healing well.  Denies any pain.  States range of motion is continually increasing and improving.  Denies any paresthesias, drainage, fevers, pain or other complaints.  States healing well and here for suture removal.   Past Medical History:  Diagnosis Date  . Arthritis   . Diabetes mellitus without complication (HCC)   . GERD (gastroesophageal reflux disease)   . Hypertension   . Peripheral neuropathy     There are no active problems to display for this patient.   Past Surgical History:  Procedure Laterality Date  . ABDOMINAL HYSTERECTOMY    . CARPAL TUNNEL RELEASE    . CHOLECYSTECTOMY    . COLONOSCOPY WITH ESOPHAGOGASTRODUODENOSCOPY (EGD)    . SHOULDER ARTHROSCOPY    . SHOULDER ARTHROSCOPY Right 04/06/2015   Procedure: right shoulder arthroscopy, arthroscopic labral debridement, subacromial decompression, mini open rotator cuff repair;  Surgeon: Christena Flake, MD;  Location: [facility] ORS;  Service: Orthopedics;  Laterality: Right;     No current facility-administered medications for this encounter.   Current Outpatient Medications:  .  aspirin 81 MG tablet, Take 81 mg by mouth daily., Disp: , Rfl:  .  gabapentin (NEURONTIN) 600 MG tablet, Take 600 mg by mouth 2 (two) times daily., Disp: , Rfl:  .  glipiZIDE (GLUCOTROL) 5 MG tablet, Take 5 mg by mouth daily before  breakfast., Disp: , Rfl:  .  losartan (COZAAR) 50 MG tablet, Take 50 mg by mouth every morning., Disp: , Rfl:  .  lovastatin (MEVACOR) 20 MG tablet, Take 20 mg by mouth daily at 12 noon., Disp: , Rfl:  .  Multiple Vitamins-Minerals (CENTRUM SILVER ULTRA WOMENS PO), Take 1 capsule by mouth daily at 12 noon., Disp: , Rfl:  .  OMEGA-3 KRILL OIL PO, Take 1 capsule by mouth daily at 12 noon., Disp: , Rfl:  .  omeprazole (PRILOSEC) 40 MG capsule, Take 40 mg by mouth 2 (two) times daily., Disp: , Rfl:  .  oxyCODONE (ROXICODONE) 5 MG immediate release tablet, Take 1-2 tablets (5-10 mg total) by mouth every 4 (four) hours as needed for severe pain., Disp: 60 tablet, Rfl: 0  Allergies Metformin and related and Reglan [metoclopramide]  Family History  Problem Relation Age of Onset  . Breast cancer Neg Hx     Social History Social History   Tobacco Use  . Smoking status: Never Smoker  . Smokeless tobacco: Never Used  Substance Use Topics  . Alcohol use: No  . Drug use: No    Review of Systems Constitutional: No fever/chills Cardiovascular: Denies chest pain. Respiratory: Denies shortness of breath. Gastrointestinal: No abdominal pain.   Skin: As above.   ____________________________________________   PHYSICAL EXAM:  VITAL SIGNS: ED Triage Vitals [07/15/18 0815]  Enc Vitals Group     BP (!) 183/84     Pulse Rate 100     Resp 18  Temp 98.2 F (36.8 C)     Temp Source Oral     SpO2 97 %     Weight 140 lb (63.5 kg)     Height 5\' 6"  (1.676 m)     Head Circumference      Peak Flow      Pain Score 0     Pain Loc      Pain Edu?      Excl. in GC?     Constitutional: Alert and oriented. Well appearing and in no acute distress. ENT      Head: Normocephalic and atraumatic. Cardiovascular: Normal rate, regular rhythm. Grossly normal heart sounds.  Good peripheral circulation. Respiratory: Normal respiratory effort without tachypnea nor retractions. Breath sounds are clear and  equal bilaterally. No wheezes, rales, rhonchi. Musculoskeletal: Steady gait.  Bilateral distal radial pulses equal and easily palpated. Neurologic:  Normal speech and language. Speech is normal. No gait instability.  Skin:  Skin is warm, dry.  Except: X3 mid palmar vertical suture mattress sutures present with well approximated skin, no erythema, no drainage, non-tender, hand with good flexion and extension and good range of motion, no paresthesias, no motor or tendon deficit noted. Psychiatric: Mood and affect are normal. Speech and behavior are normal. Patient exhibits appropriate insight and judgment   ___________________________________________   LABS (all labs ordered are listed, but only abnormal results are displayed)  Labs Reviewed - No data to display ____________________________________________   PROCEDURES Procedures   Procedure explained and verbal consent obtained.  Suture removal.  Area cleaned with alcohol swab and sutures removed x3.  INITIAL IMPRESSION / ASSESSMENT AND PLAN / ED COURSE  Pertinent labs & imaging results that were available during my care of the patient were reviewed by me and considered in my medical decision making (see chart for details).  Well-appearing patient.  No acute distress.  Suture removal visit, sutures removed by myself.  Patient tolerated well.  Encourage supportive care and keeping clean and monitoring.   Discussed follow up and return parameters including no resolution or any worsening concerns. Patient verbalized understanding and agreed to plan.   ____________________________________________   FINAL CLINICAL IMPRESSION(S) / ED DIAGNOSES  Final diagnoses:  None     ED Discharge Orders    None       Note: This dictation was prepared with Dragon dictation along with smaller phrase technology. Any transcriptional errors that result from this process are unintentional.         Renford Dills, NP 07/15/18 1003

## 2018-12-01 ENCOUNTER — Encounter: Payer: Self-pay | Admitting: Occupational Therapy

## 2018-12-01 ENCOUNTER — Other Ambulatory Visit: Payer: Self-pay

## 2018-12-01 ENCOUNTER — Ambulatory Visit: Payer: Medicare Other | Attending: Plastic Surgery | Admitting: Occupational Therapy

## 2018-12-01 DIAGNOSIS — L905 Scar conditions and fibrosis of skin: Secondary | ICD-10-CM

## 2018-12-01 DIAGNOSIS — M25631 Stiffness of right wrist, not elsewhere classified: Secondary | ICD-10-CM

## 2018-12-01 DIAGNOSIS — M6281 Muscle weakness (generalized): Secondary | ICD-10-CM | POA: Insufficient documentation

## 2018-12-01 DIAGNOSIS — M79641 Pain in right hand: Secondary | ICD-10-CM | POA: Diagnosis not present

## 2018-12-01 DIAGNOSIS — M25641 Stiffness of right hand, not elsewhere classified: Secondary | ICD-10-CM | POA: Insufficient documentation

## 2018-12-01 NOTE — Patient Instructions (Signed)
Heat Scar massage , soft tissue mobs and desentitization - massage, soft and rougher textures   several times during day - 3-5 min  Wrist extention and flexion stretch  Digits extention stretch Tendon glides - blocked and composite to 2 cm foam block  10 reps  2-3 x day  And opposition to all digits   5 reps each finger and slide down 5th

## 2018-12-02 NOTE — Therapy (Signed)
[place] [facility] MEDICINE [address], [place], [address] Phone: [phone]   Fax:  [phone]  Occupational Therapy Evaluation  Patient Details  Name: Dawn Farmer MRN: [id number] Date of Birth: 1934-11-27 Referring Provider (OT): Doyce Loose   Encounter Date: 12/01/2018  OT End of Session - 12/01/18 0901    Visit Number  1    Number of Visits  12    Date for OT Re-Evaluation  01/12/19    OT Start Time  0801    OT Stop Time  0853    OT Time Calculation (min)  52 min    Activity Tolerance  Patient tolerated treatment well    Behavior During Therapy  [facility] for tasks assessed/performed       Past Medical History:  Diagnosis Date  . Arthritis   . Diabetes mellitus without complication (HCC)   . GERD (gastroesophageal reflux disease)   . Hypertension   . Peripheral neuropathy     Past Surgical History:  Procedure Laterality Date  . ABDOMINAL HYSTERECTOMY    . CARPAL TUNNEL RELEASE    . CHOLECYSTECTOMY    . COLONOSCOPY WITH ESOPHAGOGASTRODUODENOSCOPY (EGD)    . SHOULDER ARTHROSCOPY    . SHOULDER ARTHROSCOPY Right 04/06/2015   Procedure: right shoulder arthroscopy, arthroscopic labral debridement, subacromial decompression, mini open rotator cuff repair;  Surgeon: Christena Flake, MD;  Location: [facility] ORS;  Service: Orthopedics;  Laterality: Right;    There were no vitals filed for this visit.  Subjective Assessment - 12/01/18 0854    Subjective   I had CTS and trigger finger release in Aug in [place] and then had my stitches out 10-14 days later here - but then I had since then 2 episodes of cellulitis and pain with use , cannot make fist or straighten my middle fingers - scar tender and still stuck     Patient Stated Goals  I want to be able to use my hand in all my activities - to be able to grip, lift ,pull, squeeze objects     Currently in Pain?  Yes    Pain Score  10-Worst pain ever    Pain Location   Hand   wrist    Pain Descriptors / Indicators  Aching;Tightness    Pain Type  Surgical pain    Pain Onset  More than a month ago    Aggravating Factors   with opening or stretching my hand , greeting people - gripping or using it        Paraffin done to R hand prior to review of HEP - ed pt on scar massage , CT spreads to decrease scar tissue and pain  Hand out provided:   Heat Scar massage , soft tissue mobs and desentitization - massage, soft and rougher textures   several times during day - 3-5 min  Wrist extention and flexion stretch  Digits extention stretch Tendon glides - blocked and composite to 2 cm foam block  10 reps  2-3 x day  And opposition to all digits   5 reps each finger and slide down 5th                  OT Education - 12/01/18 0901    Education Details  findings of eval and HEP     Person(s) Educated  Patient    Methods  Explanation;Demonstration;Handout;Verbal cues    Comprehension  Verbalized understanding;Returned demonstration  OT Short Term Goals - 12/01/18 1651      OT SHORT TERM GOAL #1   Title  Pt to be independent in HEP to decrease scar tissue, desentitization , increase ROM to use hand in more than 50% of ADls    Baseline   hyper sensitive , scar adhesions , flexore contracture and decrease flexion - and using hand only 20%     Time  3    Period  Weeks    Status  New    Target Date  12/22/18      OT SHORT TERM GOAL #2   Title  Digits extention increase to less than -10 in digits to get gloves on , reach in pocket with no increase symptoms     Baseline  pain increase with extnetion , extention -25 and -20 in 3rd and 4th -    Time  3    Period  Weeks    Status  New    Target Date  12/22/18      OT SHORT TERM GOAL #3   Title  pain improve on PRWHE by more than 20  points     Baseline  pain at eval on PRWHE 33/50     Time  2    Period  Weeks    Status  New    Target Date  12/15/18        OT Long Term Goals -  12/01/18 1655      OT LONG TERM GOAL #1   Title  Pt digits AROM improve to [facility] to touch palm and extend to reach in pocket with no increase symptoms     Baseline  flexion MC's 75 -80 and PIP 80-85 degrees - pain 10/10 with PROM     Time  5    Period  Weeks    Status  New    Target Date  01/05/19      OT LONG TERM GOAL #2   Title  R wrist AROM improve to WNL to push up from chair and clean surfaces     Baseline  Wrist extnetion and flexion impaired     Time  4    Period  Weeks    Status  New    Target Date  12/29/18      OT LONG TERM GOAL #3   Title  Function score on PRWHE improve with more than 20 points     Baseline  function score on PRHWE at eval 41/50     Time  6    Period  Weeks    Status  New    Target Date  01/12/19      OT LONG TERM GOAL #4   Title  Grip strength in R hand improve to more than 50% compare to L hand     Baseline  NT     Time  6    Period  Weeks    Status  New    Target Date  01/12/19            Plan - 12/01/18 1639    Clinical Impression Statement  Pt present at evaluation this date 5 months out from R CTS and 3rd digit trigger finger release -- pt had  surgery in [place] , then had stitches taken out at the ER - but since then  2 episodes of Cellulitis -  pt has scar tissue that is adhere and hyper sensitive , as well as flexor  contractures at PIP 's of all digits - with 3rd the worse and decrease digits flexion , extnetion and wrist AROM - increase pain and  scar tisue - decrease strength - all limiting her functional use of R dominant hand     Occupational performance deficits (Please refer to evaluation for details):  ADL's;IADL's;Play;Leisure;Rest and Sleep    Rehab Potential  Good    Current Impairments/barriers affecting progress:  5 months out from surgery     OT Frequency  2x / week    OT Duration  6 weeks    OT Treatment/Interventions  Self-care/ADL training;Paraffin;Therapeutic exercise;Ultrasound;Fluidtherapy;Contrast  Bath;Passive range of motion;Splinting;Manual Therapy;Patient/family education    Plan  assess progress with HEP and upgrade as needed     Clinical Decision Making  Several treatment options, min-mod task modification necessary    OT Home Exercise Plan  see pt instruction     Consulted and Agree with Plan of Care  Patient       Patient will benefit from skilled therapeutic intervention in order to improve the following deficits and impairments:  Decreased range of motion, Impaired UE functional use, Impaired flexibility, Decreased strength, Decreased knowledge of precautions, Pain  Visit Diagnosis: Pain in right hand - Plan: Ot plan of care cert/re-cert  Scar condition and fibrosis of skin - Plan: Ot plan of care cert/re-cert  Stiffness of right hand, not elsewhere classified - Plan: Ot plan of care cert/re-cert  Stiffness of right wrist, not elsewhere classified - Plan: Ot plan of care cert/re-cert  Muscle weakness (generalized) - Plan: Ot plan of care cert/re-cert    Problem List There are no active problems to display for this patient.   Oletta CohnuPreez, Prudence Heiny OTR/L,CLT 12/02/2018, 9:31 AM  [place] [facility] [address] [place], [place], [address] Phone: [phone]   Fax:  [phone]  Name: Dawn Farmer MRN: [id number] Date of Birth: 1935-11-03

## 2018-12-08 ENCOUNTER — Ambulatory Visit: Payer: Medicare Other | Attending: Plastic Surgery | Admitting: Occupational Therapy

## 2018-12-08 DIAGNOSIS — M79641 Pain in right hand: Secondary | ICD-10-CM | POA: Diagnosis present

## 2018-12-08 DIAGNOSIS — M6281 Muscle weakness (generalized): Secondary | ICD-10-CM | POA: Diagnosis present

## 2018-12-08 DIAGNOSIS — M25641 Stiffness of right hand, not elsewhere classified: Secondary | ICD-10-CM | POA: Diagnosis present

## 2018-12-08 DIAGNOSIS — L905 Scar conditions and fibrosis of skin: Secondary | ICD-10-CM | POA: Insufficient documentation

## 2018-12-08 DIAGNOSIS — M25631 Stiffness of right wrist, not elsewhere classified: Secondary | ICD-10-CM | POA: Insufficient documentation

## 2018-12-08 NOTE — Therapy (Signed)
[place] [facility] [address] [place], [place], [address] Phone: [phone]   Fax:  [phone]  Occupational Therapy Treatment  Patient Details  Name: Dawn Farmer Dress MRN: [id number] Date of Birth: June 22, 1935 Referring Provider (OT): Doyce Looseed Jackson   Encounter Date: 12/08/2018  OT End of Session - 12/08/18 0948    Visit Number  2    Number of Visits  12    Date for OT Re-Evaluation  01/12/19    OT Start Time  0901    OT Stop Time  0943    OT Time Calculation (min)  42 min    Activity Tolerance  Patient tolerated treatment well    Behavior During Therapy  [facility] for tasks assessed/performed       Past Medical History:  Diagnosis Date  . Arthritis   . Diabetes mellitus without complication (HCC)   . GERD (gastroesophageal reflux disease)   . Hypertension   . Peripheral neuropathy     Past Surgical History:  Procedure Laterality Date  . ABDOMINAL HYSTERECTOMY    . CARPAL TUNNEL RELEASE    . CHOLECYSTECTOMY    . COLONOSCOPY WITH ESOPHAGOGASTRODUODENOSCOPY (EGD)    . SHOULDER ARTHROSCOPY    . SHOULDER ARTHROSCOPY Right 04/06/2015   Procedure: right shoulder arthroscopy, arthroscopic labral debridement, subacromial decompression, mini open rotator cuff repair;  Surgeon: Christena FlakeJohn J Poggi, MD;  Location: [facility];  Service: Orthopedics;  Laterality: Right;    There were no vitals filed for this visit.  Subjective Assessment - 12/08/18 0943    Subjective   Doing okay - I did my exercises about 5 x day - my wrist at the base of my pinkie is hurting - about 10/10 at times - working the scar - but my middle finger is more straight - and scar not as tender with textures     Patient Stated Goals  I want to be able to use my hand in all my activities - to be able to grip, lift ,pull, squeeze objects     Currently in Pain?  Yes    Pain Score  6     Pain Location  --   middle finger and base of hypothenar eminence    Pain  Orientation  Right    Pain Descriptors / Indicators  Aching;Tightness;Tender    Pain Type  Surgical pain    Pain Onset  More than Farmer month ago                   OT Treatments/Exercises (OP) - 12/08/18 0001      Ultrasound   Ultrasound Location  volar CT scar and volar 3rd digit and scar     Ultrasound Parameters  3.3MHZ, 20%, 0.8 intensity       RUE Paraffin   Number Minutes Paraffin  10 Minutes    RUE Paraffin Location  Hand    Comments  prior to soft tissue mobs          Paraffin done to R hand - pain decrease over FCU  - ed pt on scar massage , CT spreads to decrease scar tissue and pain - but decrease to 2-3 x day  Keep pain under 2-3/10  Hand out provided again   Heat Scar massage( 2-3 min at time - 2-3 x day )   , soft tissue mobs BUT hold off on desentitization - massage, soft and rougher textures   Wrist extention and flexion stretch  change to over edge of table AAROM - 10 reps  Digits extention stretch Tendon glides - blocked and composite to 2 cm foam block still -and pain free  10 reps  2-3 x day  And opposition to all digits   5 reps each finger and slide down 5th   Hold off on tight grip , or over gripping objects and built up handles or grip     OT Education - 12/08/18 0948    Education Details  changes in HEP and progress    Person(s) Educated  Patient    Methods  Explanation;Demonstration;Handout;Verbal cues    Comprehension  Verbalized understanding;Returned demonstration       OT Short Term Goals - 12/01/18 1651      OT SHORT TERM GOAL #1   Title  Pt to be independent in HEP to decrease scar tissue, desentitization , increase ROM to use hand in more than 50% of ADls    Baseline   hyper sensitive , scar adhesions , flexore contracture and decrease flexion - and using hand only 20%     Time  3    Period  Weeks    Status  New    Target Date  12/22/18      OT SHORT TERM GOAL #2   Title  Digits extention increase to less than -10 in  digits to get gloves on , reach in pocket with no increase symptoms     Baseline  pain increase with extnetion , extention -25 and -20 in 3rd and 4th -    Time  3    Period  Weeks    Status  New    Target Date  12/22/18      OT SHORT TERM GOAL #3   Title  pain improve on PRWHE by more than 20  points     Baseline  pain at eval on PRWHE 33/50     Time  2    Period  Weeks    Status  New    Target Date  12/15/18        OT Long Term Goals - 12/01/18 1655      OT LONG TERM GOAL #1   Title  Pt digits AROM improve to [facility] to touch palm and extend to reach in pocket with no increase symptoms     Baseline  flexion MC's 75 -80 and PIP 80-85 degrees - pain 10/10 with PROM     Time  5    Period  Weeks    Status  New    Target Date  01/05/19      OT LONG TERM GOAL #2   Title  R wrist AROM improve to WNL to push up from chair and clean surfaces     Baseline  Wrist extnetion and flexion impaired     Time  4    Period  Weeks    Status  New    Target Date  12/29/18      OT LONG TERM GOAL #3   Title  Function score on PRWHE improve with more than 20 points     Baseline  function score on PRHWE at eval 41/50     Time  6    Period  Weeks    Status  New    Target Date  01/12/19      OT LONG TERM GOAL #4   Title  Grip strength in R hand improve to more than 50% compare to  L hand     Baseline  NT     Time  6    Period  Weeks    Status  New    Target Date  01/12/19            Plan - 12/08/18 0948    Clinical Impression Statement  Pt show increase wrist extnetion , 3rd digit extention - and scar tissue tenderness - but pt report pain over FCU , tender with grip and wrist flexion -but decrease after paraffin - had pain 6/0 over 3rd digit with fisting - pt to avoid tight fist , and decrease HEP to 3 x day     Occupational performance deficits (Please refer to evaluation for details):  ADL's;IADL's;Play;Leisure;Rest and Sleep    Rehab Potential  Good    Current Impairments/barriers  affecting progress:  5 months out from surgery     OT Frequency  2x / week    OT Duration  6 weeks    OT Treatment/Interventions  Self-care/ADL training;Paraffin;Therapeutic exercise;Ultrasound;Fluidtherapy;Contrast Bath;Passive range of motion;Splinting;Manual Therapy;Patient/family education    Plan  assess progress with HEP and make changes  as needed     Clinical Decision Making  Several treatment options, min-mod task modification necessary    OT Home Exercise Plan  see pt instruction     Consulted and Agree with Plan of Care  Patient       Patient will benefit from skilled therapeutic intervention in order to improve the following deficits and impairments:  Decreased range of motion, Impaired UE functional use, Impaired flexibility, Decreased strength, Decreased knowledge of precautions, Pain  Visit Diagnosis: Pain in right hand  Scar condition and fibrosis of skin  Stiffness of right hand, not elsewhere classified  Stiffness of right wrist, not elsewhere classified  Muscle weakness (generalized)    Problem List There are no active problems to display for this patient.   Oletta CohnuPreez, Valicia Rief OTR/L,CLT 12/08/2018, 10:19 AM  [facility] [address] [place], [place], [address] Phone: [phone]   Fax:  [phone]  Name: Dawn Farmer Carbajal MRN: [id number] Date of Birth: 1935-02-03

## 2018-12-08 NOTE — Patient Instructions (Signed)
Decrease to 3 x day HEP  Scar massage ( 2-3 min per scar - 2-3 x day - hold off on desentitization  Modify grip - not over grip and built up handles  Ice massage as needed over volar wrist with pain Hold off on prayer stretch and wrist flexion stretch - change to over edge of table  AAROM for wrist flexion and extnetion

## 2018-12-11 ENCOUNTER — Ambulatory Visit: Payer: Medicare Other | Admitting: Occupational Therapy

## 2018-12-11 DIAGNOSIS — M25631 Stiffness of right wrist, not elsewhere classified: Secondary | ICD-10-CM

## 2018-12-11 DIAGNOSIS — M6281 Muscle weakness (generalized): Secondary | ICD-10-CM

## 2018-12-11 DIAGNOSIS — M79641 Pain in right hand: Secondary | ICD-10-CM | POA: Diagnosis not present

## 2018-12-11 DIAGNOSIS — M25641 Stiffness of right hand, not elsewhere classified: Secondary | ICD-10-CM

## 2018-12-11 DIAGNOSIS — L905 Scar conditions and fibrosis of skin: Secondary | ICD-10-CM

## 2018-12-11 NOTE — Therapy (Signed)
[place] [facility] MEDICINE [address] [place], [address] Phone: [phone]   Fax:  [phone]  Occupational Therapy Treatment  Patient Details  Name: Dawn Farmer MRN: [id number] Date of Birth: 08/22/1935 Referring Provider (OT): Doyce Loose   Encounter Date: 12/11/2018  OT End of Session - 12/11/18 0836    Visit Number  3    Number of Visits  12    Date for OT Re-Evaluation  01/12/19    OT Start Time  0750    OT Stop Time  0822    OT Time Calculation (min)  32 min    Activity Tolerance  Patient tolerated treatment well    Behavior During Therapy  [facility] for tasks assessed/performed       Past Medical History:  Diagnosis Date  . Arthritis   . Diabetes mellitus without complication (HCC)   . GERD (gastroesophageal reflux disease)   . Hypertension   . Peripheral neuropathy     Past Surgical History:  Procedure Laterality Date  . ABDOMINAL HYSTERECTOMY    . CARPAL TUNNEL RELEASE    . CHOLECYSTECTOMY    . COLONOSCOPY WITH ESOPHAGOGASTRODUODENOSCOPY (EGD)    . SHOULDER ARTHROSCOPY    . SHOULDER ARTHROSCOPY Right 04/06/2015   Procedure: right shoulder arthroscopy, arthroscopic labral debridement, subacromial decompression, mini open rotator cuff repair;  Surgeon: Christena Flake, MD;  Location: [facility] ORS;  Service: Orthopedics;  Laterality: Right;    There were no vitals filed for this visit.  Subjective Assessment - 12/11/18 0834    Subjective   It is better the pain - now just still in the palm and into my middle finger - still cannot make tight fist or tap middle finger- but pain is down to 4/10    Patient Stated Goals  I want to be able to use my hand in all my activities - to be able to grip, lift ,pull, squeeze objects     Currently in Pain?  Yes    Pain Score  4     Pain Location  Hand    Pain Orientation  Right    Pain Descriptors / Indicators  Aching;Tightness    Pain Type  Surgical pain    Pain Onset   More than a month ago       Pt's pain and tenderness over FCU and hypothenar eminence better this date - pain more in palm and over 3rd digit  Still unable to tap 3rd      Paraffin done to R hand - scar massage , CT spreads to decrease scar tissue  Done graston tool nr 2 for sweeping over volar wrist and forearm And brushing over volar 3rd , palm and scar tissue - Used Xtractor 5 x over each scar with fisting AROM  Keep pain under 2-3/10   cont with Heat but ice afterwards  Scar massage( 2-3 min at time - 2-3 x day )   ,  Wrist extention stretch to be slides on the wall - need to feel slight pull in palm and into 3rd digit  Digits extention stretch cont  Tendon glides - blocked and composite to 2 cm foam block still -and pain free  10 reps 2-3 x day  And opposition to all digits  5 reps each finger and slide down 5th   Hold off on tight grip , or over gripping objects and built up handles or grip  OT Education - 12/11/18 [phone]    Education Details  changes in HEP and progress    Person(s) Educated  Patient    Methods  Explanation;Demonstration;Handout;Verbal cues    Comprehension  Verbalized understanding;Returned demonstration       OT Short Term Goals - 12/01/18 1651      OT SHORT TERM GOAL #1   Title  Pt to be independent in HEP to decrease scar tissue, desentitization , increase ROM to use hand in more than 50% of ADls    Baseline   hyper sensitive , scar adhesions , flexore contracture and decrease flexion - and using hand only 20%     Time  3    Period  Weeks    Status  New    Target Date  12/22/18      OT SHORT TERM GOAL #2   Title  Digits extention increase to less than -10 in digits to get gloves on , reach in pocket with no increase symptoms     Baseline  pain increase with extnetion , extention -25 and -20 in 3rd and 4th -    Time  3    Period  Weeks    Status  New    Target Date  12/22/18      OT SHORT TERM GOAL  #3   Title  pain improve on PRWHE by more than 20  points     Baseline  pain at eval on PRWHE 33/50     Time  2    Period  Weeks    Status  New    Target Date  12/15/18        OT Long Term Goals - 12/01/18 1655      OT LONG TERM GOAL #1   Title  Pt digits AROM improve to [facility] to touch palm and extend to reach in pocket with no increase symptoms     Baseline  flexion MC's 75 -80 and PIP 80-85 degrees - pain 10/10 with PROM     Time  5    Period  Weeks    Status  New    Target Date  01/05/19      OT LONG TERM GOAL #2   Title  R wrist AROM improve to WNL to push up from chair and clean surfaces     Baseline  Wrist extnetion and flexion impaired     Time  4    Period  Weeks    Status  New    Target Date  12/29/18      OT LONG TERM GOAL #3   Title  Function score on PRWHE improve with more than 20 points     Baseline  function score on PRHWE at eval 41/50     Time  6    Period  Weeks    Status  New    Target Date  01/12/19      OT LONG TERM GOAL #4   Title  Grip strength in R hand improve to more than 50% compare to L hand     Baseline  NT     Time  6    Period  Weeks    Status  New    Target Date  01/12/19            Plan - 12/11/18 9449    Clinical Impression Statement  Pt show decrease pain at wrist and over carpal tunnel scar - and hypothenar eminence - still  scar tissue at CT and trigger finger - and in palm - unable to make tight fist or extention full 3rd - FCU did not had pain this date     Occupational performance deficits (Please refer to evaluation for details):  ADL's;IADL's;Play;Leisure;Rest and Sleep    Rehab Potential  Good    Current Impairments/barriers affecting progress:  5 months out from surgery     OT Frequency  2x / week    OT Duration  4 weeks    OT Treatment/Interventions  Self-care/ADL training;Paraffin;Therapeutic exercise;Ultrasound;Fluidtherapy;Contrast Bath;Passive range of motion;Splinting;Manual Therapy;Patient/family education     Plan  assess progress with HEP and make changes  as needed     Clinical Decision Making  Several treatment options, min-mod task modification necessary    OT Home Exercise Plan  see pt instruction     Consulted and Agree with Plan of Care  Patient       Patient will benefit from skilled therapeutic intervention in order to improve the following deficits and impairments:  Decreased range of motion, Impaired UE functional use, Impaired flexibility, Decreased strength, Decreased knowledge of precautions, Pain  Visit Diagnosis: Pain in right hand  Scar condition and fibrosis of skin  Stiffness of right hand, not elsewhere classified  Stiffness of right wrist, not elsewhere classified  Muscle weakness (generalized)    Problem List There are no active problems to display for this patient.   Oletta CohnuPreez, Bedie Dominey OTR/L,CLT 12/11/2018, 8:40 AM  [place] [facility] [address] [place], [place], [address] Phone: [phone]   Fax:  [phone]  Name: Shelva Majestichyllis A Robles MRN: [id number] Date of Birth: 01-14-1935

## 2018-12-11 NOTE — Patient Instructions (Signed)
Add composite extensor stretch - against wall or on table - need to feel stretch in palm and middle finger

## 2018-12-14 ENCOUNTER — Ambulatory Visit: Payer: Medicare Other | Admitting: Occupational Therapy

## 2018-12-14 DIAGNOSIS — M25631 Stiffness of right wrist, not elsewhere classified: Secondary | ICD-10-CM

## 2018-12-14 DIAGNOSIS — L905 Scar conditions and fibrosis of skin: Secondary | ICD-10-CM

## 2018-12-14 DIAGNOSIS — M25641 Stiffness of right hand, not elsewhere classified: Secondary | ICD-10-CM

## 2018-12-14 DIAGNOSIS — M79641 Pain in right hand: Secondary | ICD-10-CM

## 2018-12-14 DIAGNOSIS — M6281 Muscle weakness (generalized): Secondary | ICD-10-CM

## 2018-12-14 NOTE — Therapy (Signed)
[facility] PH[facility]TS MEDICINE [address], [place], [address] Phone: [phone]   Fax:  [phone]  Occupational Therapy Treatment  Patient Details  Name: Dawn Farmer MRN: [id number] Date of Birth: 07/24/1935 Referring Provider (OT): Doyce Loose   Encounter Date: 12/14/2018  OT End of Session - 12/14/18 0829    Visit Number  4    Number of Visits  12    Date for OT Re-Evaluation  01/12/19    OT Start Time  0755    OT Stop Time  0825    OT Time Calculation (min)  30 min    Activity Tolerance  Patient tolerated treatment well    Behavior During Therapy  [facility] for tasks assessed/performed       Past Medical History:  Diagnosis Date  . Arthritis   . Diabetes mellitus without complication (HCC)   . GERD (gastroesophageal reflux disease)   . Hypertension   . Peripheral neuropathy     Past Surgical History:  Procedure Laterality Date  . ABDOMINAL HYSTERECTOMY    . CARPAL TUNNEL RELEASE    . CHOLECYSTECTOMY    . COLONOSCOPY WITH ESOPHAGOGASTRODUODENOSCOPY (EGD)    . SHOULDER ARTHROSCOPY    . SHOULDER ARTHROSCOPY Right 04/06/2015   Procedure: right shoulder arthroscopy, arthroscopic labral debridement, subacromial decompression, mini open rotator cuff repair;  Surgeon: Christena Flake, MD;  Location: [facility] ORS;  Service: Orthopedics;  Laterality: Right;    There were no vitals filed for this visit.  Subjective Assessment - 12/14/18 0801    Subjective   Pain is much better - just over the scar and into the middle finger joint - using it more - but when I am sitting , I am constantly rubbing or massaging my hand     Patient Stated Goals  I want to be able to use my hand in all my activities - to be able to grip, lift ,pull, squeeze objects     Pain Score  5     Pain Location  Hand    Pain Orientation  Right    Pain Descriptors / Indicators  Aching;Tightness    Pain Type  Surgical pain    Pain Onset  More than a month  ago         [facility] OT Assessment - 12/14/18 0001      Strength   Right Hand Grip (lbs)  20    Right Hand Lateral Pinch  12 lbs    Right Hand 3 Point Pinch  9 lbs    Left Hand Grip (lbs)  50    Left Hand Lateral Pinch  15 lbs    Left Hand 3 Point Pinch  8 lbs      scar massage done to trigger finger scar mostly - adhere still -done graston tool nr 2 brushing and then vibration - with great success  Done xtractore with digits flexion and extnetion - -5 x  Pt report feeling sting  Pt to not over do scar mobs - and add this date to HEP cica scar pad for nigh ttime -   extention stretch done for 3rd digit - 3 x 1 min  And then atttempting tapping  Add to HEP  Table slides change to for HEP - for composite extnetion - 20 reps  2-3 x day  Pt to work on decreasing pain over trigger finger scar and 3rd digit   not over do scar massage at home  Cont to do HEP only 2-3 x day  Add table slides for composite extention stretch and middle finger extention stretch with hand on table  Attempt tapping  Scar massage 2-3 x day  Tendon glides Add cica scar pad for night time              OT Education - 12/14/18 0829    Education Details  HEP review and to not over do massage     Person(s) Educated  Patient    Methods  Explanation;Demonstration;Handout;Verbal cues    Comprehension  Verbalized understanding;Returned demonstration       OT Short Term Goals - 12/01/18 1651      OT SHORT TERM GOAL #1   Title  Pt to be independent in HEP to decrease scar tissue, desentitization , increase ROM to use hand in more than 50% of ADls    Baseline   hyper sensitive , scar adhesions , flexore contracture and decrease flexion - and using hand only 20%     Time  3    Period  Weeks    Status  New    Target Date  12/22/18      OT SHORT TERM GOAL #2   Title  Digits extention increase to less than -10 in digits to get gloves on , reach in pocket with no increase symptoms     Baseline   pain increase with extnetion , extention -25 and -20 in 3rd and 4th -    Time  3    Period  Weeks    Status  New    Target Date  12/22/18      OT SHORT TERM GOAL #3   Title  pain improve on PRWHE by more than 20  points     Baseline  pain at eval on PRWHE 33/50     Time  2    Period  Weeks    Status  New    Target Date  12/15/18        OT Long Term Goals - 12/01/18 1655      OT LONG TERM GOAL #1   Title  Pt digits AROM improve to [facility] to touch palm and extend to reach in pocket with no increase symptoms     Baseline  flexion MC's 75 -80 and PIP 80-85 degrees - pain 10/10 with PROM     Time  5    Period  Weeks    Status  New    Target Date  01/05/19      OT LONG TERM GOAL #2   Title  R wrist AROM improve to WNL to push up from chair and clean surfaces     Baseline  Wrist extnetion and flexion impaired     Time  4    Period  Weeks    Status  New    Target Date  12/29/18      OT LONG TERM GOAL #3   Title  Function score on PRWHE improve with more than 20 points     Baseline  function score on PRHWE at eval 41/50     Time  6    Period  Weeks    Status  New    Target Date  01/12/19      OT LONG TERM GOAL #4   Title  Grip strength in R hand improve to more than 50% compare to L hand     Baseline  NT  Time  6    Period  Weeks    Status  New    Target Date  01/12/19            Plan - 12/14/18 0830    Clinical Impression Statement  Pt scar and pain improved greatly at CT- but cont to have trigger finger scar adhere - and limiting her 3rd digit extnetion - pt report pain about 5/10 over trigger finger scar into middle finger - pt to back off little on scar massage - not over do it -grip is still significantly decrease in R hand compare to L - pt going to be out of town for funeral - will call me when back     Occupational performance deficits (Please refer to evaluation for details):  ADL's;IADL's;Play;Leisure;Rest and Sleep    Rehab Potential  Good    Current  Impairments/barriers affecting progress:  5 months out from surgery     OT Frequency  2x / week    OT Duration  2 weeks    OT Treatment/Interventions  Self-care/ADL training;Paraffin;Therapeutic exercise;Ultrasound;Fluidtherapy;Contrast Bath;Passive range of motion;Splinting;Manual Therapy;Patient/family education    Plan  Pt to call when back in town - decrease pain and scar tissue at trigger finger     Clinical Decision Making  Several treatment options, min-mod task modification necessary    OT Home Exercise Plan  see pt instruction     Consulted and Agree with Plan of Care  Patient       Patient will benefit from skilled therapeutic intervention in order to improve the following deficits and impairments:  Decreased range of motion, Impaired UE functional use, Impaired flexibility, Decreased strength, Decreased knowledge of precautions, Pain  Visit Diagnosis: Pain in right hand  Scar condition and fibrosis of skin  Stiffness of right hand, not elsewhere classified  Stiffness of right wrist, not elsewhere classified  Muscle weakness (generalized)    Problem List There are no active problems to display for this patient.   Oletta CohnuPreez, Bryn Saline 12/14/2018, 8:33 AM  [facility] [address] [place], [place], [address] Phone: [phone]   Fax:  [phone]  Name: Dawn Farmer MRN: [id number] Date of Birth: [birth year]

## 2018-12-14 NOTE — Patient Instructions (Signed)
Cont to do HEP only 2-3 x day  Add table slides for composite extention stretch and middle finger extention stretch with hand on table  Attempt tapping  Scar massage 2-3 x day  Tendon glides Add cica scar pad for night time

## 2018-12-18 ENCOUNTER — Ambulatory Visit: Payer: Medicare Other | Admitting: Occupational Therapy

## 2019-01-04 ENCOUNTER — Other Ambulatory Visit: Payer: Self-pay | Admitting: Family Medicine

## 2019-01-04 ENCOUNTER — Other Ambulatory Visit (HOSPITAL_COMMUNITY): Payer: Self-pay | Admitting: Family Medicine

## 2019-01-04 DIAGNOSIS — E1122 Type 2 diabetes mellitus with diabetic chronic kidney disease: Secondary | ICD-10-CM

## 2019-01-04 DIAGNOSIS — M25561 Pain in right knee: Secondary | ICD-10-CM

## 2019-01-06 ENCOUNTER — Ambulatory Visit
Admission: RE | Admit: 2019-01-06 | Discharge: 2019-01-06 | Disposition: A | Discharge status: Home / Self Care | Payer: Medicare Other | Source: Ambulatory Visit | Attending: Family Medicine | Admitting: Family Medicine

## 2019-01-06 ENCOUNTER — Other Ambulatory Visit: Payer: Self-pay

## 2019-01-06 DIAGNOSIS — M25561 Pain in right knee: Secondary | ICD-10-CM | POA: Insufficient documentation

## 2019-01-06 DIAGNOSIS — E1122 Type 2 diabetes mellitus with diabetic chronic kidney disease: Secondary | ICD-10-CM | POA: Diagnosis present

## 2019-01-12 ENCOUNTER — Ambulatory Visit
Admission: RE | Admit: 2019-01-12 | Discharge: 2019-01-12 | Disposition: A | Discharge status: Home / Self Care | Payer: Medicare Other | Attending: Family Medicine | Admitting: Family Medicine

## 2019-01-12 ENCOUNTER — Other Ambulatory Visit: Payer: Self-pay

## 2019-01-12 ENCOUNTER — Other Ambulatory Visit: Payer: Self-pay | Admitting: Family Medicine

## 2019-01-12 ENCOUNTER — Ambulatory Visit
Admission: RE | Admit: 2019-01-12 | Discharge: 2019-01-12 | Disposition: A | Discharge status: Home / Self Care | Payer: Medicare Other | Source: Ambulatory Visit | Attending: Family Medicine | Admitting: Family Medicine

## 2019-01-12 DIAGNOSIS — G8929 Other chronic pain: Secondary | ICD-10-CM | POA: Insufficient documentation

## 2019-01-12 DIAGNOSIS — M25561 Pain in right knee: Principal | ICD-10-CM

## 2019-04-06 ENCOUNTER — Other Ambulatory Visit: Payer: Self-pay | Admitting: Student

## 2019-04-06 DIAGNOSIS — R1310 Dysphagia, unspecified: Secondary | ICD-10-CM

## 2019-04-09 ENCOUNTER — Ambulatory Visit
Admission: RE | Admit: 2019-04-09 | Discharge: 2019-04-09 | Disposition: A | Discharge status: Home / Self Care | Payer: Medicare Other | Source: Ambulatory Visit | Attending: Student | Admitting: Student

## 2019-04-09 ENCOUNTER — Other Ambulatory Visit: Payer: Self-pay

## 2019-04-09 DIAGNOSIS — R1310 Dysphagia, unspecified: Secondary | ICD-10-CM | POA: Diagnosis not present

## 2019-04-20 ENCOUNTER — Other Ambulatory Visit: Payer: Self-pay

## 2019-04-20 ENCOUNTER — Other Ambulatory Visit
Admission: RE | Admit: 2019-04-20 | Discharge: 2019-04-20 | Disposition: A | Discharge status: Home / Self Care | Payer: Medicare Other | Source: Ambulatory Visit | Attending: Unknown Physician Specialty | Admitting: Unknown Physician Specialty

## 2019-04-20 DIAGNOSIS — Z1159 Encounter for screening for other viral diseases: Secondary | ICD-10-CM | POA: Diagnosis present

## 2019-04-21 LAB — NOVEL CORONAVIRUS, NAA (HOSP ORDER, SEND-OUT TO REF LAB; TAT 18-24 HRS): SARS-CoV-2, NAA: NOT DETECTED

## 2019-04-22 ENCOUNTER — Encounter: Payer: Self-pay | Admitting: *Deleted

## 2019-04-23 ENCOUNTER — Ambulatory Visit
Admission: RE | Admit: 2019-04-23 | Discharge: 2019-04-23 | Disposition: A | Discharge status: Home / Self Care | Payer: Medicare Other | Attending: Unknown Physician Specialty | Admitting: Unknown Physician Specialty

## 2019-04-23 ENCOUNTER — Ambulatory Visit: Payer: Medicare Other | Admitting: Registered Nurse

## 2019-04-23 ENCOUNTER — Encounter: Payer: Self-pay | Admitting: *Deleted

## 2019-04-23 ENCOUNTER — Encounter
Admission: RE | Disposition: A | Discharge status: Home / Self Care | Payer: Self-pay | Source: Home / Self Care | Attending: Unknown Physician Specialty

## 2019-04-23 DIAGNOSIS — K219 Gastro-esophageal reflux disease without esophagitis: Secondary | ICD-10-CM | POA: Insufficient documentation

## 2019-04-23 DIAGNOSIS — K449 Diaphragmatic hernia without obstruction or gangrene: Secondary | ICD-10-CM | POA: Insufficient documentation

## 2019-04-23 DIAGNOSIS — R1310 Dysphagia, unspecified: Secondary | ICD-10-CM | POA: Diagnosis not present

## 2019-04-23 DIAGNOSIS — Z7984 Long term (current) use of oral hypoglycemic drugs: Secondary | ICD-10-CM | POA: Insufficient documentation

## 2019-04-23 DIAGNOSIS — Z79899 Other long term (current) drug therapy: Secondary | ICD-10-CM | POA: Insufficient documentation

## 2019-04-23 DIAGNOSIS — Z7982 Long term (current) use of aspirin: Secondary | ICD-10-CM | POA: Insufficient documentation

## 2019-04-23 DIAGNOSIS — E1140 Type 2 diabetes mellitus with diabetic neuropathy, unspecified: Secondary | ICD-10-CM | POA: Diagnosis not present

## 2019-04-23 DIAGNOSIS — I10 Essential (primary) hypertension: Secondary | ICD-10-CM | POA: Diagnosis not present

## 2019-04-23 HISTORY — DX: Cardiac murmur, unspecified: R01.1

## 2019-04-23 HISTORY — PX: BALLOON DILATION: SHX5330

## 2019-04-23 HISTORY — DX: Anxiety disorder, unspecified: F41.9

## 2019-04-23 HISTORY — PX: ESOPHAGOGASTRODUODENOSCOPY (EGD) WITH PROPOFOL: SHX5813

## 2019-04-23 LAB — GLUCOSE, CAPILLARY: Glucose-Capillary: 155 mg/dL — ABNORMAL HIGH (ref 70–99)

## 2019-04-23 SURGERY — ESOPHAGOGASTRODUODENOSCOPY (EGD) WITH PROPOFOL
Anesthesia: General

## 2019-04-23 MED ORDER — LIDOCAINE HCL (PF) 2 % IJ SOLN
INTRAMUSCULAR | Status: AC
  Filled 2019-04-23: qty 10

## 2019-04-23 MED ORDER — GLYCOPYRROLATE 0.2 MG/ML IJ SOLN
INTRAMUSCULAR | Status: AC
  Filled 2019-04-23: qty 1

## 2019-04-23 MED ORDER — PROPOFOL 10 MG/ML IV BOLUS
INTRAVENOUS | Status: DC | PRN
  Administered 2019-04-23: 80 mg via INTRAVENOUS

## 2019-04-23 MED ORDER — SODIUM CHLORIDE 0.9 % IV SOLN
INTRAVENOUS | Status: DC
  Administered 2019-04-23: 08:00:00 via INTRAVENOUS

## 2019-04-23 MED ORDER — SODIUM CHLORIDE 0.9 % IV SOLN: INTRAVENOUS | Status: DC

## 2019-04-23 MED ORDER — PROPOFOL 500 MG/50ML IV EMUL
INTRAVENOUS | Status: AC
  Filled 2019-04-23: qty 50

## 2019-04-23 MED ORDER — PROPOFOL 500 MG/50ML IV EMUL
INTRAVENOUS | Status: DC | PRN
  Administered 2019-04-23: 200 ug/kg/min via INTRAVENOUS

## 2019-04-23 MED ORDER — SUCCINYLCHOLINE CHLORIDE 20 MG/ML IJ SOLN
INTRAMUSCULAR | Status: AC
  Filled 2019-04-23: qty 1

## 2019-04-23 MED ORDER — LIDOCAINE HCL (CARDIAC) PF 100 MG/5ML IV SOSY
PREFILLED_SYRINGE | INTRAVENOUS | Status: DC | PRN
  Administered 2019-04-23: 80 mg via INTRAVENOUS

## 2019-04-23 NOTE — Anesthesia Post-op Follow-up Note (Signed)
Anesthesia QCDR form completed.        

## 2019-04-23 NOTE — H&P (Signed)
Primary Care Physician:  Sharyne Peach, MD Primary Gastroenterologist:  Dr. Vira Agar  Pre-Procedure History & Physical: HPI:  Dawn Farmer is a 84 y.o. female is here for an endoscopy.  She has significant dysphagia   Past Medical History:  Diagnosis Date  . Anxiety   . Arthritis   . Diabetes mellitus without complication ([place])   . GERD (gastroesophageal reflux disease)   . Heart murmur   . Hypertension   . Peripheral neuropathy     Past Surgical History:  Procedure Laterality Date  . ABDOMINAL HYSTERECTOMY    . CARPAL TUNNEL RELEASE    . CHOLECYSTECTOMY    . COLONOSCOPY WITH ESOPHAGOGASTRODUODENOSCOPY (EGD)    . rotator cuff tear Right 2016  . SHOULDER ARTHROSCOPY    . SHOULDER ARTHROSCOPY Right 04/06/2015   Procedure: right shoulder arthroscopy, arthroscopic labral debridement, subacromial decompression, mini open rotator cuff repair;  Surgeon: Corky Mull, MD;  Location: [facility] ORS;  Service: Orthopedics;  Laterality: Right;    Prior to Admission medications   Medication Sig Start Date End Date Taking? Authorizing Provider  amLODipine (NORVASC) 10 MG tablet Take 10 mg by mouth daily.   Yes [provider]  aspirin 81 MG chewable tablet Chew 81 mg by mouth daily.   Yes [provider]  CALCIUM-VITAMIN D PO Take by mouth.   Yes [provider]  gabapentin (NEURONTIN) 600 MG tablet Take 600 mg by mouth 2 (two) times daily.   Yes [provider]  glipiZIDE (GLUCOTROL) 5 MG tablet Take 5 mg by mouth daily before breakfast.   Yes [provider]  losartan (COZAAR) 50 MG tablet Take 50 mg by mouth every morning.   Yes [provider]  lovastatin (MEVACOR) 20 MG tablet Take 20 mg by mouth daily at 12 noon.   Yes [provider]  omeprazole (PRILOSEC) 40 MG capsule Take 40 mg by mouth 2 (two) times daily.   Yes [provider]  aspirin 81 MG tablet Take 81 mg by mouth daily.    [provider]   Multiple Vitamins-Minerals (CENTRUM SILVER ULTRA WOMENS PO) Take 1 capsule by mouth daily at 12 noon.    [provider]  OMEGA-3 KRILL OIL PO Take 1 capsule by mouth daily at 12 noon.    [provider]  oxyCODONE (ROXICODONE) 5 MG immediate release tablet Take 1-2 tablets (5-10 mg total) by mouth every 4 (four) hours as needed for severe pain. Patient not taking: Reported on 04/23/2019 04/06/15   Poggi, Marshall Cork, MD    Allergies as of 04/14/2019 - Review Complete 12/01/2018  Allergen Reaction Noted  . Metformin and related  03/29/2015  . Reglan [metoclopramide]  03/29/2015    Family History  Problem Relation Age of Onset  . Breast cancer Neg Hx     Social History   Socioeconomic History  . Marital status: Widowed    Spouse name: Not on file  . Number of children: Not on file  . Years of education: Not on file  . Highest education level: Not on file  Occupational History  . Not on file  Social Needs  . Financial resource strain: Not on file  . Food insecurity    Worry: Not on file    Inability: Not on file  . Transportation needs    Medical: Not on file    Non-medical: Not on file  Tobacco Use  . Smoking status: Never Smoker  . Smokeless tobacco: Never  Used  Substance and Sexual Activity  . Alcohol use: No  . Drug use: No  . Sexual activity: Not on file  Lifestyle  . Physical activity    Days per week: Not on file    Minutes per session: Not on file  . Stress: Not on file  Relationships  . Social Musicianconnections    Talks on phone: Not on file    Gets together: Not on file    Attends religious service: Not on file    Active member of club or organization: Not on file    Attends meetings of clubs or organizations: Not on file    Relationship status: Not on file  . Intimate partner violence    Fear of current or ex partner: Not on file    Emotionally abused: Not on file    Physically abused: Not on file    Forced sexual activity: Not on file  Other  Topics Concern  . Not on file  Social History Narrative  . Not on file    Review of Systems: See HPI, otherwise negative ROS  Physical Exam: BP (!) 171/75   Pulse 97   Temp (!) 96.7 F (35.9 C) (Tympanic)   Resp 16   Ht 5\' 6"  (1.676 m)   Wt 62.6 kg   SpO2 100%   BMI 22.27 kg/m  General:   Alert,  pleasant and cooperative in NAD Head:  Normocephalic and atraumatic. Neck:  Supple; no masses or thyromegaly. Lungs:  Clear throughout to auscultation.    Heart:  Regular rate and rhythm. Abdomen:  Soft, nontender and nondistended. Normal bowel sounds, without guarding, and without rebound.   Neurologic:  Alert and  oriented x4;  grossly normal neurologically.  Impression/Plan: Dawn Farmer is here for an endoscopy to be performed for Dysphagia   Risks, benefits, limitations, and alternatives regarding  endoscopy have been reviewed with the patient.  Questions have been answered.  All parties agreeable.   Lynnae PrudeELLIOTT, Hilbert Briggs, MD  04/23/2019, 8:25 AM

## 2019-04-23 NOTE — Transfer of Care (Signed)
Immediate Anesthesia Transfer of Care Note  Patient: Dawn Farmer  Procedure(s) Performed: ESOPHAGOGASTRODUODENOSCOPY (EGD) WITH PROPOFOL (N/A ) BALLOON DILATION (N/A )  Patient Location: PACU  Anesthesia Type:General  Level of Consciousness: sedated  Airway & Oxygen Therapy: Patient Spontanous Breathing  Post-op Assessment: Report given to RN  Post vital signs: stable  Last Vitals:  Vitals Value Taken Time  BP 93/39 04/23/19 0849  Temp 36.2 C 04/23/19 0849  Pulse 82 04/23/19 0850  Resp 21 04/23/19 0850  SpO2 95 % 04/23/19 0850  Vitals shown include unvalidated device data.  Last Pain:  Vitals:   04/23/19 0849  TempSrc: Tympanic  PainSc: Asleep         Complications: No apparent anesthesia complications

## 2019-04-23 NOTE — Anesthesia Preprocedure Evaluation (Signed)
Anesthesia Evaluation  Patient identified by MRN, date of birth, ID band Patient awake    Reviewed: Allergy & Precautions, H&P , NPO status , Patient's Chart, lab work & pertinent test results, reviewed documented beta blocker date and time   History of Anesthesia Complications Negative for: history of anesthetic complications  Airway Mallampati: I  TM Distance: >3 FB Neck ROM: full    Dental  (+) Partial Lower, Dental Advidsory Given, Missing   Pulmonary neg pulmonary ROS,           Cardiovascular Exercise Tolerance: Good hypertension, (-) angina(-) Past MI (-) dysrhythmias + Valvular Problems/Murmurs      Neuro/Psych neg Seizures PSYCHIATRIC DISORDERS Anxiety  Neuromuscular disease    GI/Hepatic Neg liver ROS, GERD  ,  Endo/Other  diabetes, Well Controlled, Oral Hypoglycemic Agents  Renal/GU negative Renal ROS  negative genitourinary   Musculoskeletal   Abdominal   Peds  Hematology negative hematology ROS (+)   Anesthesia Other Findings Past Medical History: No date: Anxiety No date: Arthritis No date: Diabetes mellitus without complication (HCC) No date: GERD (gastroesophageal reflux disease) No date: Heart murmur No date: Hypertension No date: Peripheral neuropathy   Reproductive/Obstetrics negative OB ROS                             Anesthesia Physical Anesthesia Plan  ASA: II  Anesthesia Plan: General   Post-op Pain Management:    Induction: Intravenous  PONV Risk Score and Plan: 3 and Propofol infusion and TIVA  Airway Management Planned: Natural Airway and Nasal Cannula  Additional Equipment:   Intra-op Plan:   Post-operative Plan:   Informed Consent: I have reviewed the patients History and Physical, chart, labs and discussed the procedure including the risks, benefits and alternatives for the proposed anesthesia with the patient or authorized representative who  has indicated his/her understanding and acceptance.     Dental Advisory Given  Plan Discussed with: Anesthesiologist, CRNA and Surgeon  Anesthesia Plan Comments:         Anesthesia Quick Evaluation

## 2019-04-23 NOTE — Op Note (Addendum)
[facility] Gastroenterology Patient Name: Dawn Farmer Procedure Date: 04/23/2019 8:15 AM MRN: [id number] Account #: [id number] Date of Birth: [birth year] Admit Type: Outpatient Age: [age] [facility] 4 Gender: Female Note Status: Finalized Procedure:            Upper GI endoscopy Indications:          Dysphagia Providers:            Scot Junobert T. Zarielle Cea, MD Medicines:            Propofol per Anesthesia Complications:        No immediate complications. Procedure:            Pre-Anesthesia Assessment:                       - After reviewing the risks and benefits, the patient                        was deemed in satisfactory condition to undergo the                        procedure.                       After obtaining informed consent, the endoscope was                        passed under direct vision. Throughout the procedure,                        the patient's blood pressure, pulse, and oxygen                        saturations were monitored continuously. The Endoscope                        was introduced through the mouth, and advanced to the                        second part of duodenum. The upper GI endoscopy was                        somewhat difficult due to narrowing. The patient                        tolerated the procedure fairly well. Findings:      A medium-sized hiatal hernia was present.      The examined esophagus was otherwise normal.      The stomach was normal.      The examined duodenum was normal.      The scope was removed and a [id number] F Malony dilator was attempted to pass       but the dilator would not pass and the scope was passed back down and a       guide wire was passed and the scope was withdrawn and a 85F was passed       over the guide wire with some resistance. Impression:           - Medium-sized hiatal hernia.                       - Normal  esophagus.                       - Normal stomach.                       -  Normal examined duodenum.                       - No specimens collected. Recommendation:       - The findings and recommendations were discussed with                        the patient. Manya Silvas, MD 04/23/2019 8:54:11 AM This report has been signed electronically. Number of Addenda: 0 Note Initiated On: 04/23/2019 8:15 AM Total Procedure Duration: 0 hours 13 minutes 45 seconds       [facility]

## 2019-04-23 NOTE — Anesthesia Postprocedure Evaluation (Signed)
Anesthesia Post Note  Patient: Dawn Farmer  Procedure(s) Performed: ESOPHAGOGASTRODUODENOSCOPY (EGD) WITH PROPOFOL (N/A ) BALLOON DILATION (N/A )  Patient location during evaluation: Endoscopy Anesthesia Type: General Level of consciousness: awake and alert Pain management: pain level controlled Vital Signs Assessment: post-procedure vital signs reviewed and stable Respiratory status: spontaneous breathing, nonlabored ventilation, respiratory function stable and patient connected to nasal cannula oxygen Cardiovascular status: blood pressure returned to baseline and stable Postop Assessment: no apparent nausea or vomiting Anesthetic complications: no     Last Vitals:  Vitals:   04/23/19 0859 04/23/19 0909  BP: (!) 124/53 138/65  Pulse: 97 91  Resp: 20 (!) 23  Temp:    SpO2: 97% 100%    Last Pain:  Vitals:   04/23/19 0909  TempSrc:   PainSc: 0-No pain                 Martha Clan

## 2019-07-04 ENCOUNTER — Ambulatory Visit
Admission: EM | Admit: 2019-07-04 | Discharge: 2019-07-04 | Disposition: A | Discharge status: Home / Self Care | Payer: Medicare Other | Attending: Emergency Medicine | Admitting: Emergency Medicine

## 2019-07-04 ENCOUNTER — Other Ambulatory Visit: Payer: Self-pay

## 2019-07-04 ENCOUNTER — Encounter: Payer: Self-pay | Admitting: Emergency Medicine

## 2019-07-04 DIAGNOSIS — H9203 Otalgia, bilateral: Secondary | ICD-10-CM | POA: Diagnosis not present

## 2019-07-04 DIAGNOSIS — H6123 Impacted cerumen, bilateral: Secondary | ICD-10-CM | POA: Diagnosis not present

## 2019-07-04 DIAGNOSIS — M26623 Arthralgia of bilateral temporomandibular joint: Secondary | ICD-10-CM

## 2019-07-04 NOTE — ED Provider Notes (Signed)
HPI  SUBJECTIVE:  Dawn Farmer is a 84 y.o. female who presents with bilateral ear pain for the past 3 days.  She describes it as throbbing, intermittent, lasting minutes to hours.  She states that her ears feel full, as if they are "plugged up".  No fevers, nasal congestion, allergy symptoms, change in hearing.  No otorrhea, it is not associated chewing yawning.  She does not grind her teeth at night.  No recent swimming.  No vertigo, sore throat, dental pain.  No foreign body insertion.  No antibiotics in the past month.  No antipyretic in the past 4 to 6 hours.  She tried Visine eardrops with some improvement in her symptoms.  Symptoms worse with walking.  Past medical history of diabetes, hypertension, vertigo.  No history of temporomandibular joint disorder, frequent otitis media, chronic kidney disease.  ZOX:WRUEAVPMD:George, Marylin CrosbySionne A, MD   Past Medical History:  Diagnosis Date  . Anxiety   . Arthritis   . Diabetes mellitus without complication (HCC)   . GERD (gastroesophageal reflux disease)   . Heart murmur   . Hypertension   . Peripheral neuropathy     Past Surgical History:  Procedure Laterality Date  . ABDOMINAL HYSTERECTOMY    . BALLOON DILATION N/A 04/23/2019   Procedure: BALLOON DILATION;  Surgeon: Scot JunElliott, Robert T, MD;  [facility];  Service: Endoscopy;  Laterality: N/A;  . CARPAL TUNNEL RELEASE    . CHOLECYSTECTOMY    . COLONOSCOPY WITH ESOPHAGOGASTRODUODENOSCOPY (EGD)    . ESOPHAGOGASTRODUODENOSCOPY (EGD) WITH PROPOFOL N/A 04/23/2019   Procedure: ESOPHAGOGASTRODUODENOSCOPY (EGD) WITH PROPOFOL;  Surgeon: Scot JunElliott, Robert T, MD;  [facility];  Service: Endoscopy;  Laterality: N/A;  . rotator cuff tear Right 2016  . SHOULDER ARTHROSCOPY    . SHOULDER ARTHROSCOPY Right 04/06/2015   Procedure: right shoulder arthroscopy, arthroscopic labral debridement, subacromial decompression, mini open rotator cuff repair;  Surgeon: Christena FlakeJohn J Poggi, MD;  Location: [facility]  ORS;  Service: Orthopedics;  Laterality: Right;    Family History  Problem Relation Age of Onset  . Breast cancer Neg Hx     Social History   Tobacco Use  . Smoking status: Never Smoker  . Smokeless tobacco: Never Used  Substance Use Topics  . Alcohol use: No  . Drug use: No    No current facility-administered medications for this encounter.   Current Outpatient Medications:  .  amLODipine (NORVASC) 10 MG tablet, Take 10 mg by mouth daily., Disp: , Rfl:  .  aspirin 81 MG chewable tablet, Chew 81 mg by mouth daily., Disp: , Rfl:  .  CALCIUM-VITAMIN D PO, Take by mouth., Disp: , Rfl:  .  gabapentin (NEURONTIN) 600 MG tablet, Take 600 mg by mouth 2 (two) times daily., Disp: , Rfl:  .  glipiZIDE (GLUCOTROL) 5 MG tablet, Take 5 mg by mouth daily before breakfast., Disp: , Rfl:  .  losartan (COZAAR) 50 MG tablet, Take 50 mg by mouth every morning., Disp: , Rfl:  .  lovastatin (MEVACOR) 20 MG tablet, Take 20 mg by mouth daily at 12 noon., Disp: , Rfl:  .  Multiple Vitamins-Minerals (CENTRUM SILVER ULTRA WOMENS PO), Take 1 capsule by mouth daily at 12 noon., Disp: , Rfl:  .  OMEGA-3 KRILL OIL PO, Take 1 capsule by mouth daily at 12 noon., Disp: , Rfl:  .  omeprazole (PRILOSEC) 40 MG capsule, Take 40 mg by mouth 2 (two) times daily., Disp: , Rfl:  .  aspirin 81 MG  tablet, Take 81 mg by mouth daily., Disp: , Rfl:   Allergies  Allergen Reactions  . Metformin And Related   . Reglan [Metoclopramide]      ROS  As noted in HPI.   Physical Exam  BP (!) 156/84 (BP Location: Left Arm)   Pulse 95   Temp 98.1 F (36.7 C) (Oral)   Resp 14   Ht 5\' 6"  (1.676 m)   Wt 62.6 kg   SpO2 100%   BMI 22.27 kg/m   Constitutional: Well developed, well nourished, no acute distress Eyes:  EOMI, conjunctiva normal bilaterally HENT: Normocephalic, atraumatic,mucus membranes moist.  Bilateral external ears normal.  Bilateral tenderness over the tragus.  No pain with traction on pinna bilaterally.   No pain with palpation of mastoid bilaterally.  Bilateral cerumen impaction.  Bilateral TMJ tenderness.  Patient states palpation of the TMJ reproduces her pain.  No appreciable crepitus. Neck: No cervical lymphadenopathy. Respiratory: Normal inspiratory effort Cardiovascular: Normal rate GI: nondistended skin: No rash, skin intact Musculoskeletal: no deformities Neurologic: Alert & oriented x 3, no focal neuro deficits Psychiatric: Speech and behavior appropriate   ED Course   Medications - No data to display  Orders Placed This Encounter  Procedures  . Ear wax removal    bilateral    Standing Status:   Standing    Number of Occurrences:   1    No results found for this or any previous visit (from the past 24 hour(s)). No results found.  ED Clinical Impression  1. Bilateral temporomandibular joint pain      ED Assessment/Plan  Presentation most consistent with otalgia secondary to temporomandibular joint arthralgia.  However will have the ears irrigated to make sure she does not have an otitis media.  Plan to send home with ibuprofen 400 mg combined with 1 g of Tylenol together 3-4 times a day as needed for pain, soft diet for the next several days, advised follow-up with dentist for a bite guard.  No Flexeril due to age, concern that it would increase her risk for fall, withholding steroids due to diabetes.  Post irrigation, TMs appear normal.  Patient has follow-up with her primary care physician on Friday.  Discussed labs, imaging, MDM, treatment plan, and plan for follow-up with patient.. patient agrees with plan.   No orders of the defined types were placed in this encounter.   *This clinic note was created using Dragon dictation software. Therefore, there may be occasional mistakes despite careful proofreading.   ?    Melynda Ripple, MD 07/04/19 [phone]

## 2019-07-04 NOTE — ED Triage Notes (Signed)
Patient c/o right ear pain that started on Thursday.

## 2019-07-04 NOTE — Discharge Instructions (Addendum)
400 mg of ibuprofen combined with 500 mg of Tylenol 3 times a day as needed for pain. Soft diet for the next few days.

## 2020-04-01 ENCOUNTER — Other Ambulatory Visit: Payer: Self-pay

## 2020-04-01 ENCOUNTER — Ambulatory Visit (INDEPENDENT_AMBULATORY_CARE_PROVIDER_SITE_OTHER): Payer: Federal, State, Local not specified - PPO

## 2020-04-01 ENCOUNTER — Encounter: Payer: Self-pay | Admitting: Emergency Medicine

## 2020-04-01 ENCOUNTER — Ambulatory Visit
Admission: EM | Admit: 2020-04-01 | Discharge: 2020-04-01 | Disposition: A | Discharge status: Home / Self Care | Payer: Federal, State, Local not specified - PPO | Attending: Family Medicine | Admitting: Family Medicine

## 2020-04-01 DIAGNOSIS — M5441 Lumbago with sciatica, right side: Secondary | ICD-10-CM | POA: Diagnosis not present

## 2020-04-01 MED ORDER — TRAMADOL HCL 50 MG PO TABS: 50.0000 mg | ORAL_TABLET | Freq: Three times a day (TID) | ORAL | 0 refills | Status: DC | PRN

## 2020-04-01 MED ORDER — PREDNISONE 10 MG (21) PO TBPK: ORAL_TABLET | ORAL | 0 refills | Status: DC

## 2020-04-01 NOTE — ED Triage Notes (Signed)
Patient c/o right hip pain that started on Wed.  Patient has been taking Tylenol and applying Lidocaine patches with no relief.  Patient denies injury or fall.

## 2020-04-01 NOTE — Discharge Instructions (Signed)
Meds as prescribed.  Follow up with PCP.  Take care  Dr. Adriana Simas

## 2020-04-01 NOTE — ED Provider Notes (Signed)
[facility]    CSN: [id number] Arrival date & time: 04/01/20  0815      History   Chief Complaint Chief Complaint  Patient presents with  . Hip Pain    right   HPI   85 year old female presents with right hip pain.  She states that this started abruptly on Wednesday.  Denies fall, trauma, injury.  She localizes the pain to the right buttock/lateral hip and states that it radiates down her right leg.  She called her primary care physician and was instructed to use lidocaine patches and Tylenol.  She states that she has had little improvement.  She rates her pain as 9/10 in severity.  Seems to be worse with activity.  No relieving factors.  No other associated symptoms.  No other complaints.  Past Medical History:  Diagnosis Date  . Anxiety   . Arthritis   . Diabetes mellitus without complication (HCC)   . GERD (gastroesophageal reflux disease)   . Heart murmur   . Hypertension   . Peripheral neuropathy    Past Surgical History:  Procedure Laterality Date  . ABDOMINAL HYSTERECTOMY    . BALLOON DILATION N/A 04/23/2019   Procedure: BALLOON DILATION;  Surgeon: Scot Jun, MD;  Location: [facility] ENDOSCOPY;  Service: Endoscopy;  Laterality: N/A;  . CARPAL TUNNEL RELEASE    . CHOLECYSTECTOMY    . COLONOSCOPY WITH ESOPHAGOGASTRODUODENOSCOPY (EGD)    . ESOPHAGOGASTRODUODENOSCOPY (EGD) WITH PROPOFOL N/A 04/23/2019   Procedure: ESOPHAGOGASTRODUODENOSCOPY (EGD) WITH PROPOFOL;  Surgeon: Scot Jun, MD;  Location: [facility] ENDOSCOPY;  Service: Endoscopy;  Laterality: N/A;  . rotator cuff tear Right 2016  . SHOULDER ARTHROSCOPY    . SHOULDER ARTHROSCOPY Right 04/06/2015   Procedure: right shoulder arthroscopy, arthroscopic labral debridement, subacromial decompression, mini open rotator cuff repair;  Surgeon: Christena Flake, MD;  Location: [facility] ORS;  Service: Orthopedics;  Laterality: Right;    OB History   No obstetric history on file.      Home Medications     Prior to Admission medications   Medication Sig Start Date End Date Taking? Authorizing Provider  amLODipine (NORVASC) 10 MG tablet Take 10 mg by mouth daily.   Yes [provider]  aspirin 81 MG tablet Take 81 mg by mouth daily.   Yes [provider]  CALCIUM-VITAMIN D PO Take by mouth.   Yes [provider]  gabapentin (NEURONTIN) 600 MG tablet Take 600 mg by mouth 2 (two) times daily.   Yes [provider]  glipiZIDE (GLUCOTROL) 5 MG tablet Take 5 mg by mouth daily before breakfast.   Yes [provider]  losartan (COZAAR) 50 MG tablet Take 50 mg by mouth every morning.   Yes [provider]  lovastatin (MEVACOR) 20 MG tablet Take 20 mg by mouth daily at 12 noon.   Yes [provider]  Multiple Vitamins-Minerals (CENTRUM SILVER ULTRA WOMENS PO) Take 1 capsule by mouth daily at 12 noon.   Yes [provider]  OMEGA-3 KRILL OIL PO Take 1 capsule by mouth daily at 12 noon.   Yes [provider]  omeprazole (PRILOSEC) 40 MG capsule Take 40 mg by mouth 2 (two) times daily.   Yes [provider]  predniSONE (STERAPRED UNI-PAK 21 TAB) 10 MG (21) TBPK tablet 6 tablets on day 1; decrease by 1 tablet daily until gone. 04/01/20   Tommie Sams, DO  traMADol (ULTRAM) 50 MG tablet Take 1 tablet (50 mg total) by  mouth every 8 (eight) hours as needed for moderate pain or severe pain. 04/01/20   Tommie Sams, DO    Family History Family History  Problem Relation Age of Onset  . Breast cancer Neg Hx     Social History Social History   Tobacco Use  . Smoking status: Never Smoker  . Smokeless tobacco: Never Used  Substance Use Topics  . Alcohol use: No  . Drug use: No     Allergies   Metformin and related and Reglan [metoclopramide]   Review of Systems Review of Systems  Musculoskeletal:       Right hip pain.   Physical Exam Triage Vital Signs ED Triage Vitals  Enc Vitals Group     BP 04/01/20  0826 (!) 182/71     Pulse Rate 04/01/20 0826 97     Resp 04/01/20 0826 14     Temp 04/01/20 0826 98.3 F (36.8 C)     Temp Source 04/01/20 0826 Oral     SpO2 04/01/20 0826 99 %     Weight 04/01/20 0823 129 lb (58.5 kg)     Height 04/01/20 0823 5\' 6"  (1.676 m)     Head Circumference --      Peak Flow --      Pain Score 04/01/20 0823 9     Pain Loc --      Pain Edu? --      Excl. in GC? --    Updated Vital Signs BP (!) 182/71 (BP Location: Right Arm)   Pulse 97   Temp 98.3 F (36.8 C) (Oral)   Resp 14   Ht 5\' 6"  (1.676 m)   Wt 58.5 kg   SpO2 99%   BMI 20.82 kg/m   Visual Acuity Right Eye Distance:   Left Eye Distance:   Bilateral Distance:    Right Eye Near:   Left Eye Near:    Bilateral Near:     Physical Exam Vitals and nursing note reviewed.  Constitutional:      General: She is not in acute distress.    Appearance: Normal appearance. She is not ill-appearing.  HENT:     Head: Normocephalic and atraumatic.  Eyes:     General:        Right eye: No discharge.        Left eye: No discharge.     Conjunctiva/sclera: Conjunctivae normal.  Cardiovascular:     Rate and Rhythm: Normal rate and regular rhythm.     Heart sounds: No murmur.  Pulmonary:     Effort: Pulmonary effort is normal.     Breath sounds: No wheezing, rhonchi or rales.  Neurological:     Mental Status: She is alert.  Psychiatric:        Mood and Affect: Mood normal.        Behavior: Behavior normal.    UC Treatments / Results  Labs (all labs ordered are listed, but only abnormal results are displayed) Labs Reviewed - No data to display  EKG   Radiology DG Lumbar Spine Complete  Result Date: 04/01/2020 CLINICAL DATA:  Right hip pain EXAM: LUMBAR SPINE - COMPLETE 4+ VIEW COMPARISON:  None. FINDINGS: Five lumbar type vertebral bodies are well visualized. Vertebral body height is well maintained. Multilevel disc space narrowing and osteophytic changes are seen. No anterolisthesis is  noted. Facet hypertrophic changes are seen. No pars defects are noted. No soft tissue abnormality is noted. IMPRESSION: Multilevel degenerative change without acute abnormality.  Electronically Signed   By: Inez Catalina M.D.   On: 04/01/2020 09:07   DG Hip Unilat W or Wo Pelvis 2-3 Views Right  Result Date: 04/01/2020 CLINICAL DATA:  Right hip pain EXAM: DG HIP (WITH OR WITHOUT PELVIS) 2-3V RIGHT COMPARISON:  None. FINDINGS: Alignment is anatomic. There is no acute fracture. Joint space is preserved. Sacroiliac joints are unremarkable. IMPRESSION: Negative. Electronically Signed   By: Macy Mis M.D.   On: 04/01/2020 09:07    Procedures Procedures (including critical care time)  Medications Ordered in UC Medications - No data to display  Initial Impression / Assessment and Plan / UC Course  I have reviewed the triage vital signs and the nursing notes.  Pertinent labs & imaging results that were available during my care of the patient were reviewed by me and considered in my medical decision making (see chart for details).    85 year old female presents with acute right-sided low back pain with right-sided sciatica.  X-rays of the lumbar spine as well as the hip obtained given patient's complaint.  X-rays independently reviewed by me.  Interpretation: Lumbar film with degenerative changes.  Hip film with no evidence of arthropathy or fracture.  Treating with brief course of prednisone and tramadol as needed.  Final Clinical Impressions(s) / UC Diagnoses   Final diagnoses:  Acute right-sided low back pain with right-sided sciatica     Discharge Instructions     Meds as prescribed.  Follow up with PCP.  Take care  Dr. Lacinda Axon    ED Prescriptions    Medication Sig Dispense Auth. Provider   predniSONE (STERAPRED UNI-PAK 21 TAB) 10 MG (21) TBPK tablet 6 tablets on day 1; decrease by 1 tablet daily until gone. 21 tablet Catlyn Shipton G, DO   traMADol (ULTRAM) 50 MG tablet Take 1  tablet (50 mg total) by mouth every 8 (eight) hours as needed for moderate pain or severe pain. 15 tablet Thersa Salt G, DO     I have reviewed the PDMP during this encounter.   Coral Spikes, DO 04/01/20 1012

## 2020-04-18 ENCOUNTER — Inpatient Hospital Stay
Admit: 2020-04-18 | Discharge: 2020-04-20 | DRG: 309 | Disposition: A | Discharge status: Home / Self Care | Payer: Medicare HMO | Source: Ambulatory Visit | Attending: Internal Medicine | Admitting: Internal Medicine

## 2020-04-18 ENCOUNTER — Ambulatory Visit
Admission: EM | Admit: 2020-04-18 | Discharge: 2020-04-18 | Disposition: A | Discharge status: Other Acute Inpatient Hospital | Payer: Federal, State, Local not specified - PPO | Attending: Nurse Practitioner | Admitting: Nurse Practitioner

## 2020-04-18 ENCOUNTER — Other Ambulatory Visit: Payer: Self-pay

## 2020-04-18 DIAGNOSIS — I4891 Unspecified atrial fibrillation: Secondary | ICD-10-CM

## 2020-04-18 DIAGNOSIS — Z79899 Other long term (current) drug therapy: Secondary | ICD-10-CM

## 2020-04-18 DIAGNOSIS — R079 Chest pain, unspecified: Secondary | ICD-10-CM

## 2020-04-18 DIAGNOSIS — M1990 Unspecified osteoarthritis, unspecified site: Secondary | ICD-10-CM | POA: Diagnosis present

## 2020-04-18 DIAGNOSIS — Z7984 Long term (current) use of oral hypoglycemic drugs: Secondary | ICD-10-CM

## 2020-04-18 DIAGNOSIS — E1142 Type 2 diabetes mellitus with diabetic polyneuropathy: Secondary | ICD-10-CM | POA: Diagnosis present

## 2020-04-18 DIAGNOSIS — E119 Type 2 diabetes mellitus without complications: Secondary | ICD-10-CM | POA: Diagnosis not present

## 2020-04-18 DIAGNOSIS — F419 Anxiety disorder, unspecified: Secondary | ICD-10-CM | POA: Diagnosis present

## 2020-04-18 DIAGNOSIS — Z7982 Long term (current) use of aspirin: Secondary | ICD-10-CM

## 2020-04-18 DIAGNOSIS — R0600 Dyspnea, unspecified: Secondary | ICD-10-CM

## 2020-04-18 DIAGNOSIS — E871 Hypo-osmolality and hyponatremia: Secondary | ICD-10-CM | POA: Diagnosis present

## 2020-04-18 DIAGNOSIS — I10 Essential (primary) hypertension: Secondary | ICD-10-CM | POA: Diagnosis not present

## 2020-04-18 DIAGNOSIS — Z833 Family history of diabetes mellitus: Secondary | ICD-10-CM

## 2020-04-18 DIAGNOSIS — E785 Hyperlipidemia, unspecified: Secondary | ICD-10-CM | POA: Diagnosis present

## 2020-04-18 DIAGNOSIS — Z20822 Contact with and (suspected) exposure to covid-19: Secondary | ICD-10-CM | POA: Diagnosis present

## 2020-04-18 DIAGNOSIS — Z888 Allergy status to other drugs, medicaments and biological substances status: Secondary | ICD-10-CM

## 2020-04-18 DIAGNOSIS — K219 Gastro-esophageal reflux disease without esophagitis: Secondary | ICD-10-CM | POA: Diagnosis present

## 2020-04-18 DIAGNOSIS — J9811 Atelectasis: Secondary | ICD-10-CM | POA: Diagnosis present

## 2020-04-18 DIAGNOSIS — Z90710 Acquired absence of both cervix and uterus: Secondary | ICD-10-CM

## 2020-04-18 DIAGNOSIS — D696 Thrombocytopenia, unspecified: Secondary | ICD-10-CM | POA: Diagnosis not present

## 2020-04-18 LAB — CBC WITH DIFFERENTIAL/PLATELET
Abs Immature Granulocytes: 0.07 10*3/uL (ref 0.00–0.07)
Basophils Absolute: 0 10*3/uL (ref 0.0–0.1)
Basophils Relative: 0 %
Eosinophils Absolute: 0 10*3/uL (ref 0.0–0.5)
Eosinophils Relative: 0 %
HCT: 32.7 % — ABNORMAL LOW (ref 36.0–46.0)
Hemoglobin: 11.7 g/dL — ABNORMAL LOW (ref 12.0–15.0)
Immature Granulocytes: 1 %
Lymphocytes Relative: 13 %
Lymphs Abs: 1.4 10*3/uL (ref 0.7–4.0)
MCH: 29.8 pg (ref 26.0–34.0)
MCHC: 35.8 g/dL (ref 30.0–36.0)
MCV: 83.4 fL (ref 80.0–100.0)
Monocytes Absolute: 1.1 10*3/uL — ABNORMAL HIGH (ref 0.1–1.0)
Monocytes Relative: 10 %
Neutro Abs: 8.3 10*3/uL — ABNORMAL HIGH (ref 1.7–7.7)
Neutrophils Relative %: 76 %
Platelets: 157 10*3/uL (ref 150–400)
RBC: 3.92 MIL/uL (ref 3.87–5.11)
RDW: 12.5 % (ref 11.5–15.5)
WBC: 11 10*3/uL — ABNORMAL HIGH (ref 4.0–10.5)
nRBC: 0 % (ref 0.0–0.2)

## 2020-04-18 LAB — BASIC METABOLIC PANEL
Anion gap: 9 (ref 5–15)
BUN: 15 mg/dL (ref 8–23)
CO2: 23 mmol/L (ref 22–32)
Calcium: 8.3 mg/dL — ABNORMAL LOW (ref 8.9–10.3)
Chloride: 94 mmol/L — ABNORMAL LOW (ref 98–111)
Creatinine, Ser: 1.02 mg/dL — ABNORMAL HIGH (ref 0.44–1.00)
GFR calc Af Amer: 59 mL/min — ABNORMAL LOW (ref >60)
GFR calc non Af Amer: 51 mL/min — ABNORMAL LOW (ref >60)
Glucose, Bld: 176 mg/dL — ABNORMAL HIGH (ref 70–99)
Potassium: 3.6 mmol/L (ref 3.5–5.1)
Sodium: 126 mmol/L — ABNORMAL LOW (ref 135–145)

## 2020-04-18 LAB — HEMOGLOBIN A1C
Hgb A1c MFr Bld: 7.8 % — ABNORMAL HIGH (ref 4.8–5.6)
Mean Plasma Glucose: 177.16 mg/dL

## 2020-04-18 LAB — TSH: TSH: 2.147 u[IU]/mL (ref 0.350–4.500)

## 2020-04-18 LAB — TROPONIN I (HIGH SENSITIVITY)
Troponin I (High Sensitivity): 10 ng/L (ref <18)
Troponin I (High Sensitivity): 9 ng/L (ref <18)

## 2020-04-18 LAB — GLUCOSE, CAPILLARY: Glucose-Capillary: 190 mg/dL — ABNORMAL HIGH (ref 70–99)

## 2020-04-18 LAB — SARS CORONAVIRUS 2 BY RT PCR (HOSPITAL ORDER, PERFORMED IN ~~LOC~~ HOSPITAL LAB): SARS Coronavirus 2: NEGATIVE

## 2020-04-18 MED ORDER — OMEGA-3-ACID ETHYL ESTERS 1 G PO CAPS
1.0000 | ORAL_CAPSULE | Freq: Every day | ORAL | Status: DC
  Administered 2020-04-18 – 2020-04-19 (×2): 1 g via ORAL
  Filled 2020-04-18 (×2): qty 1

## 2020-04-18 MED ORDER — ADULT MULTIVITAMIN W/MINERALS CH
1.0000 | ORAL_TABLET | Freq: Every day | ORAL | Status: DC
  Administered 2020-04-18 – 2020-04-19 (×2): 1 via ORAL
  Filled 2020-04-18 (×2): qty 1

## 2020-04-18 MED ORDER — METOPROLOL SUCCINATE ER 50 MG PO TB24
50.0000 mg | ORAL_TABLET | Freq: Every day | ORAL | Status: DC
  Administered 2020-04-18 – 2020-04-20 (×3): 50 mg via ORAL
  Filled 2020-04-18 (×3): qty 1

## 2020-04-18 MED ORDER — ACETAMINOPHEN 325 MG PO TABS: 650.0000 mg | ORAL_TABLET | ORAL | Status: DC | PRN

## 2020-04-18 MED ORDER — SODIUM CHLORIDE 0.9 % IV SOLN: INTRAVENOUS | Status: DC

## 2020-04-18 MED ORDER — SODIUM CHLORIDE 0.9 % IV SOLN: Freq: Once | INTRAVENOUS | Status: AC

## 2020-04-18 MED ORDER — ASPIRIN 300 MG RE SUPP
300.0000 mg | RECTAL | Status: AC
  Filled 2020-04-18: qty 1

## 2020-04-18 MED ORDER — TRAMADOL HCL 50 MG PO TABS: 50.0000 mg | ORAL_TABLET | Freq: Three times a day (TID) | ORAL | Status: DC | PRN

## 2020-04-18 MED ORDER — ASPIRIN 81 MG PO CHEW
324.0000 mg | CHEWABLE_TABLET | ORAL | Status: AC
  Administered 2020-04-18: 324 mg via ORAL
  Filled 2020-04-18: qty 4

## 2020-04-18 MED ORDER — APIXABAN 5 MG PO TABS
5.0000 mg | ORAL_TABLET | Freq: Two times a day (BID) | ORAL | Status: DC
  Administered 2020-04-18 – 2020-04-20 (×4): 5 mg via ORAL
  Filled 2020-04-18 (×4): qty 1

## 2020-04-18 MED ORDER — CALCIUM CARBONATE-VITAMIN D 500-200 MG-UNIT PO TABS
1.0000 | ORAL_TABLET | Freq: Every day | ORAL | Status: DC
  Administered 2020-04-18 – 2020-04-20 (×3): 1 via ORAL
  Filled 2020-04-18 (×3): qty 1

## 2020-04-18 MED ORDER — ASPIRIN EC 81 MG PO TBEC
81.0000 mg | DELAYED_RELEASE_TABLET | Freq: Every day | ORAL | Status: DC
  Administered 2020-04-19 – 2020-04-20 (×2): 81 mg via ORAL
  Filled 2020-04-18 (×2): qty 1

## 2020-04-18 MED ORDER — ASPIRIN 81 MG PO CHEW
324.0000 mg | CHEWABLE_TABLET | Freq: Once | ORAL | Status: AC
  Administered 2020-04-18: 162 mg via ORAL

## 2020-04-18 MED ORDER — GABAPENTIN 600 MG PO TABS
600.0000 mg | ORAL_TABLET | Freq: Two times a day (BID) | ORAL | Status: DC
  Administered 2020-04-18 – 2020-04-20 (×5): 600 mg via ORAL
  Filled 2020-04-18 (×5): qty 1

## 2020-04-18 MED ORDER — NITROGLYCERIN 0.4 MG SL SUBL: 0.4000 mg | SUBLINGUAL_TABLET | SUBLINGUAL | Status: DC | PRN

## 2020-04-18 MED ORDER — ONDANSETRON HCL 4 MG/2ML IJ SOLN: 4.0000 mg | Freq: Four times a day (QID) | INTRAMUSCULAR | Status: DC | PRN

## 2020-04-18 MED ORDER — LOSARTAN POTASSIUM 50 MG PO TABS
50.0000 mg | ORAL_TABLET | ORAL | Status: DC
  Administered 2020-04-19 – 2020-04-20 (×2): 50 mg via ORAL
  Filled 2020-04-18 (×2): qty 1

## 2020-04-18 MED ORDER — ENOXAPARIN SODIUM 40 MG/0.4ML ~~LOC~~ SOLN
40.0000 mg | SUBCUTANEOUS | Status: DC
  Administered 2020-04-18: 40 mg via SUBCUTANEOUS
  Filled 2020-04-18: qty 0.4

## 2020-04-18 MED ORDER — INSULIN ASPART 100 UNIT/ML ~~LOC~~ SOLN
0.0000 [IU] | Freq: Three times a day (TID) | SUBCUTANEOUS | Status: DC
  Administered 2020-04-18 – 2020-04-19 (×2): 3 [IU] via SUBCUTANEOUS
  Administered 2020-04-19 – 2020-04-20 (×3): 2 [IU] via SUBCUTANEOUS
  Filled 2020-04-18 (×5): qty 1

## 2020-04-18 MED ORDER — PANTOPRAZOLE SODIUM 40 MG PO TBEC
40.0000 mg | DELAYED_RELEASE_TABLET | Freq: Every day | ORAL | Status: DC
  Administered 2020-04-18 – 2020-04-20 (×3): 40 mg via ORAL
  Filled 2020-04-18 (×3): qty 1

## 2020-04-18 MED ORDER — DILTIAZEM HCL 25 MG/5ML IV SOLN
5.0000 mg | Freq: Once | INTRAVENOUS | Status: AC
  Administered 2020-04-18: 5 mg via INTRAVENOUS
  Filled 2020-04-18: qty 5

## 2020-04-18 MED ORDER — DILTIAZEM HCL-DEXTROSE 125-5 MG/125ML-% IV SOLN (PREMIX)
5.0000 mg/h | INTRAVENOUS | Status: DC
  Administered 2020-04-18 – 2020-04-19 (×2): 5 mg/h via INTRAVENOUS
  Filled 2020-04-18 (×2): qty 125

## 2020-04-18 MED ORDER — PRAVASTATIN SODIUM 20 MG PO TABS
10.0000 mg | ORAL_TABLET | Freq: Every day | ORAL | Status: DC
  Administered 2020-04-18 – 2020-04-19 (×2): 10 mg via ORAL
  Filled 2020-04-18 (×3): qty 1

## 2020-04-18 NOTE — ED Triage Notes (Signed)
Pt arrived via EMS from [facility] d/t CP and new onset of A. Fib per EMS. Pt reports CP waking her from her sleep around 2am with SOB. Pt was given 6 baby aspirin and 2 doses of 2.5mg  of Metoprolol. Pt is A&O x4 at this time.

## 2020-04-18 NOTE — ED Notes (Signed)
Resumed care from dejea rn.  Pt alert. hospitalist in with pt.   Pt eating dinner tray,.  Pt has chest tightness.  afib on monitor.

## 2020-04-18 NOTE — ED Triage Notes (Signed)
Pt reports CP that woke her from sleep at 0200 this morning.  Reports mid-CP, heaviness in chest.  Denies dizziness, nausea.  Pain does not radiate.

## 2020-04-18 NOTE — ED Provider Notes (Signed)
[facility] Emergency Department Provider Note   ____________________________________________   I have reviewed the triage vital signs and the nursing notes.   HISTORY  Chief Complaint Chest Pain   History limited by: Not Limited   HPI Dawn Farmer is a 85 y.o. female who presents to the emergency department today from urgent care because of concerns with new onset A. fib with RVR.  The patient states that she went to urgent care today because of concerns for chest pain.  It woke her up at 2 AM this morning.  Is located in her center chest.  She states that has been constant since it started.  She denies any history of atrial fibrillation.  She denies any unusual activity recently.   Records reviewed. Per medical record review patient was seen at urgent care and aspirin was given.  Past Medical History:  Diagnosis Date  . Anxiety   . Arthritis   . Diabetes mellitus without complication ([place])   . GERD (gastroesophageal reflux disease)   . Heart murmur   . Hypertension   . Peripheral neuropathy     There are no problems to display for this patient.   Past Surgical History:  Procedure Laterality Date  . ABDOMINAL HYSTERECTOMY    . BALLOON DILATION N/A 04/23/2019   Procedure: BALLOON DILATION;  Surgeon: Manya Silvas, MD;  Location: [facility] ENDOSCOPY;  Service: Endoscopy;  Laterality: N/A;  . CARPAL TUNNEL RELEASE    . CHOLECYSTECTOMY    . COLONOSCOPY WITH ESOPHAGOGASTRODUODENOSCOPY (EGD)    . ESOPHAGOGASTRODUODENOSCOPY (EGD) WITH PROPOFOL N/A 04/23/2019   Procedure: ESOPHAGOGASTRODUODENOSCOPY (EGD) WITH PROPOFOL;  Surgeon: Manya Silvas, MD;  Location: [facility] ENDOSCOPY;  Service: Endoscopy;  Laterality: N/A;  . rotator cuff tear Right 2016  . SHOULDER ARTHROSCOPY    . SHOULDER ARTHROSCOPY Right 04/06/2015   Procedure: right shoulder arthroscopy, arthroscopic labral debridement, subacromial decompression, mini open rotator cuff repair;  Surgeon:  Corky Mull, MD;  Location: [facility] ORS;  Service: Orthopedics;  Laterality: Right;    Prior to Admission medications   Medication Sig Start Date End Date Taking? Authorizing Provider  amLODipine (NORVASC) 10 MG tablet Take 10 mg by mouth daily.    [provider]  aspirin 81 MG tablet Take 81 mg by mouth daily.    [provider]  CALCIUM-VITAMIN D PO Take by mouth.    [provider]  gabapentin (NEURONTIN) 600 MG tablet Take 600 mg by mouth 2 (two) times daily.    [provider]  glipiZIDE (GLUCOTROL) 5 MG tablet Take 5 mg by mouth daily before breakfast.    [provider]  losartan (COZAAR) 50 MG tablet Take 50 mg by mouth every morning.    [provider]  lovastatin (MEVACOR) 20 MG tablet Take 20 mg by mouth daily at 12 noon.    [provider]  Multiple Vitamins-Minerals (CENTRUM SILVER ULTRA WOMENS PO) Take 1 capsule by mouth daily at 12 noon.    [provider]  OMEGA-3 KRILL OIL PO Take 1 capsule by mouth daily at 12 noon.    [provider]  omeprazole (PRILOSEC) 40 MG capsule Take 40 mg by mouth 2 (two) times daily.    [provider]  predniSONE (STERAPRED UNI-PAK 21 TAB) 10 MG (21) TBPK tablet 6 tablets on day 1; decrease by 1 tablet daily until gone. 04/01/20   Coral Spikes, DO  traMADol (ULTRAM) 50 MG tablet Take 1 tablet (50 mg  total) by mouth every 8 (eight) hours as needed for moderate pain or severe pain. 04/01/20   Tommie Sams, DO    Allergies Metformin and related and Reglan [metoclopramide]  Family History  Problem Relation Age of Onset  . Cancer Mother   . Diabetes Mother   . Cancer Father   . Breast cancer Neg Hx     Social History Social History   Tobacco Use  . Smoking status: Never Smoker  . Smokeless tobacco: Never Used  Vaping Use  . Vaping Use: Never used  Substance Use Topics  . Alcohol use: No  . Drug use: No    Review of Systems Constitutional: No  fever/chills Eyes: No visual changes. ENT: No sore throat. Cardiovascular: Positive for chest pain. Respiratory: Positive for shortness of breath. Gastrointestinal: No abdominal pain.  No nausea, no vomiting.  No diarrhea.   Genitourinary: Negative for dysuria. Musculoskeletal: Negative for back pain. Skin: Negative for rash. Neurological: Positive for headache. ____________________________________________   PHYSICAL EXAM:  VITAL SIGNS: ED Triage Vitals  Enc Vitals Group     BP 04/18/20 0936 110/60     Pulse Rate 04/18/20 0936 (!) 120     Resp 04/18/20 0936 (!) 25     Temp 04/18/20 0936 98.3 F (36.8 C)     Temp Source 04/18/20 0936 Oral     SpO2 04/18/20 0936 95 %     Weight 04/18/20 0938 138 lb (62.6 kg)     Height 04/18/20 0938 5\' 6"  (1.676 m)     Head Circumference --      Peak Flow --      Pain Score 04/18/20 0938 0   Constitutional: Alert and oriented.  Eyes: Conjunctivae are normal.  ENT      Head: Normocephalic and atraumatic.      Nose: No congestion/rhinnorhea.      Mouth/Throat: Mucous membranes are moist.      Neck: No stridor. Hematological/Lymphatic/Immunilogical: No cervical lymphadenopathy. Cardiovascular: Tachycardic, irregular rhythm.  No murmurs, rubs, or gallops.  Respiratory: Normal respiratory effort without tachypnea nor retractions. Breath sounds are clear and equal bilaterally. No wheezes/rales/rhonchi. Gastrointestinal: Soft and non tender. No rebound. No guarding.  Genitourinary: Deferred Musculoskeletal: Normal range of motion in all extremities. No lower extremity edema. Neurologic:  Normal speech and language. No gross focal neurologic deficits are appreciated.  Skin:  Skin is warm, dry and intact. No rash noted. Psychiatric: Mood and affect are normal. Speech and behavior are normal. Patient exhibits appropriate insight and judgment.  ____________________________________________    LABS (pertinent positives/negatives)  Trop hs  10 BMP na 126, k 3.6, glu 176, cr 1.02 CBC wbc 11.0, hgb 11.7, plt 157  ____________________________________________   EKG  I, 04/20/20, attending physician, personally viewed and interpreted this EKG  EKG Time: 0933 Rate: 123 Rhythm: atrial fibrillation Axis: normal Intervals: qtc 497 QRS: narrow ST changes: no st elevation Impression: abnormal ekg  ____________________________________________    RADIOLOGY  None  ____________________________________________   PROCEDURES  Procedures  ____________________________________________   INITIAL IMPRESSION / ASSESSMENT AND PLAN / ED COURSE  Pertinent labs & imaging results that were available during my care of the patient were reviewed by me and considered in my medical decision making (see chart for details).   Patient presented to the emergency department today from urgent care because of concerns for new onset A. fib.  Patient went to urgent care earlier because of concerns for chest pain.  On exam here patient is  in atrial fibrillation.  Her heart rate is still slightly elevated.  She was given some diltiazem.  Blood work does show hyponatremia concerning for dehydration.  No elevation of troponin at this time.  Given new onset A. fib and concern for dehydration will plan on admission.  Discussed findings plan with patient.  ___________________________________________   FINAL CLINICAL IMPRESSION(S) / ED DIAGNOSES  Final diagnoses:  New onset a-fib (HCC)  Hyponatremia     Note: This dictation was prepared with Dragon dictation. Any transcriptional errors that result from this process are unintentional     Phineas Semen, MD 04/18/20 1454

## 2020-04-18 NOTE — ED Provider Notes (Addendum)
[facility]    CSN: [id number] Arrival date & time: 04/18/20  0809      History   Chief Complaint Chief Complaint  Patient presents with  . Chest Pain    HPI Dawn Farmer is a 85 y.o. female.   Subjective:  Dawn Farmer is a 85 y.o. female who presents for evaluation of chest pain. Onset was acute around 2 AM. It woke the patient up from sleep. Symptoms have been unchanged since that time. The patient describes the pain as heaviness and pressure. The pain does not radiate. Patient rates pain as a 8/10 in intensity. Associated symptoms are: dyspnea, fatigue and headache. She denies any nausea, vomiting, dizziness, LE swelling or facaial/limb paraesthesias. Aggravating factors are: none. Alleviating factors are: none. Patient's cardiac risk factors are: advanced age, DM and hypertension. Patient's risk factors for DVT/PE: none. Previous cardiac testing: none. Patient denies any history of heart disease or prior MI.   The following portions of the patient's history were reviewed and updated as appropriate: allergies, current medications, past family history, past medical history, past social history, past surgical history and problem list.           Past Medical History:  Diagnosis Date  . Anxiety   . Arthritis   . Diabetes mellitus without complication (HCC)   . GERD (gastroesophageal reflux disease)   . Heart murmur   . Hypertension   . Peripheral neuropathy     There are no problems to display for this patient.   Past Surgical History:  Procedure Laterality Date  . ABDOMINAL HYSTERECTOMY    . BALLOON DILATION N/A 04/23/2019   Procedure: BALLOON DILATION;  Surgeon: Scot Jun, MD;  Location: [facility] ENDOSCOPY;  Service: Endoscopy;  Laterality: N/A;  . CARPAL TUNNEL RELEASE    . CHOLECYSTECTOMY    . COLONOSCOPY WITH ESOPHAGOGASTRODUODENOSCOPY (EGD)    . ESOPHAGOGASTRODUODENOSCOPY (EGD) WITH PROPOFOL N/A 04/23/2019   Procedure:  ESOPHAGOGASTRODUODENOSCOPY (EGD) WITH PROPOFOL;  Surgeon: Scot Jun, MD;  Location: [facility] ENDOSCOPY;  Service: Endoscopy;  Laterality: N/A;  . rotator cuff tear Right 2016  . SHOULDER ARTHROSCOPY    . SHOULDER ARTHROSCOPY Right 04/06/2015   Procedure: right shoulder arthroscopy, arthroscopic labral debridement, subacromial decompression, mini open rotator cuff repair;  Surgeon: Christena Flake, MD;  Location: [facility] ORS;  Service: Orthopedics;  Laterality: Right;    OB History   No obstetric history on file.      Home Medications    Prior to Admission medications   Medication Sig Start Date End Date Taking? Authorizing Provider  aspirin 81 MG tablet Take 81 mg by mouth daily.   Yes [provider]  amLODipine (NORVASC) 10 MG tablet Take 10 mg by mouth daily.    [provider]  CALCIUM-VITAMIN D PO Take by mouth.    [provider]  gabapentin (NEURONTIN) 600 MG tablet Take 600 mg by mouth 2 (two) times daily.    [provider]  glipiZIDE (GLUCOTROL) 5 MG tablet Take 5 mg by mouth daily before breakfast.    [provider]  losartan (COZAAR) 50 MG tablet Take 50 mg by mouth every morning.    [provider]  lovastatin (MEVACOR) 20 MG tablet Take 20 mg by mouth daily at 12 noon.    [provider]  Multiple Vitamins-Minerals (CENTRUM SILVER ULTRA WOMENS PO) Take 1 capsule by mouth daily at 12 noon.    [provider]  OMEGA-3 KRILL OIL  PO Take 1 capsule by mouth daily at 12 noon.    [provider]  omeprazole (PRILOSEC) 40 MG capsule Take 40 mg by mouth 2 (two) times daily.    [provider]  predniSONE (STERAPRED UNI-PAK 21 TAB) 10 MG (21) TBPK tablet 6 tablets on day 1; decrease by 1 tablet daily until gone. 04/01/20   Coral Spikes, DO  traMADol (ULTRAM) 50 MG tablet Take 1 tablet (50 mg total) by mouth every 8 (eight) hours as needed for moderate pain or severe pain. 04/01/20   Coral Spikes, DO     Family History Family History  Problem Relation Age of Onset  . Cancer Mother   . Diabetes Mother   . Cancer Father   . Breast cancer Neg Hx     Social History Social History   Tobacco Use  . Smoking status: Never Smoker  . Smokeless tobacco: Never Used  Vaping Use  . Vaping Use: Never used  Substance Use Topics  . Alcohol use: No  . Drug use: No     Allergies   Metformin and related and Reglan [metoclopramide]   Review of Systems Review of Systems  Constitutional: Positive for fatigue.  Eyes: Negative for visual disturbance.  Respiratory: Positive for shortness of breath.   Cardiovascular: Positive for chest pain. Negative for palpitations and leg swelling.  Gastrointestinal: Negative for nausea and vomiting.  Neurological: Positive for headaches. Negative for light-headedness and numbness.  All other systems reviewed and are negative.    Physical Exam Triage Vital Signs ED Triage Vitals  Enc Vitals Group     BP 04/18/20 0818 (!) 146/78     Pulse Rate 04/18/20 0818 79     Resp 04/18/20 0818 18     Temp 04/18/20 0818 98.1 F (36.7 C)     Temp Source 04/18/20 0818 Oral     SpO2 04/18/20 0818 100 %     Weight --      Height --      Head Circumference --      Peak Flow --      Pain Score 04/18/20 0815 8     Pain Loc --      Pain Edu? --      Excl. in [place]? --    No data found.  Updated Vital Signs BP (!) 146/78 (BP Location: Right Arm)   Pulse 79   Temp 98.1 F (36.7 C) (Oral)   Resp 18   SpO2 100%   Visual Acuity Right Eye Distance:   Left Eye Distance:   Bilateral Distance:    Right Eye Near:   Left Eye Near:    Bilateral Near:     Physical Exam Vitals and nursing note reviewed.  Constitutional:      General: She is not in acute distress.    Appearance: She is not toxic-appearing.  HENT:     Head: Normocephalic.  Cardiovascular:     Rate and Rhythm: Tachycardia present. Rhythm irregular.  Pulmonary:     Effort: Pulmonary  effort is normal.     Breath sounds: Normal breath sounds.  Chest:     Chest wall: No tenderness.  Abdominal:     Palpations: Abdomen is soft.  Musculoskeletal:        General: Normal range of motion.     Cervical back: Normal range of motion and neck supple.  Skin:    General: Skin is warm and dry.  Neurological:  General: No focal deficit present.     Mental Status: She is alert.  Psychiatric:        Mood and Affect: Mood normal.        Behavior: Behavior normal.      UC Treatments / Results  Labs (all labs ordered are listed, but only abnormal results are displayed) Labs Reviewed - No data to display  EKG atrial fibrillation, with rapid ventricular response, nonspecific ST/T wave abnormalities, interpretation by me   Radiology No results found.  Procedures ED EKG  Date/Time: 04/18/2020 9:04 AM Performed by: Lurline Idol, FNP Authorized by: Lurline Idol, FNP   Previous ECG:    Previous ECG:  Unavailable Interpretation:    Interpretation: abnormal   Rate:    ECG rate:  137   ECG rate assessment: tachycardic   Rhythm:    Rhythm: atrial fibrillation   ST segments:    ST segments:  Non-specific   (including critical care time)  Medications Ordered in UC Medications  aspirin chewable tablet 324 mg (has no administration in time range)  0.9 %  sodium chloride infusion ( Intravenous New Bag/Given 04/18/20 0850)    Initial Impression / Assessment and Plan / UC Course  I have reviewed the triage vital signs and the nursing notes.  Pertinent labs & imaging results that were available during my care of the patient were reviewed by me and considered in my medical decision making (see chart for details).    85 year old female with a history of hypertension and diabetes presenting with constant chest pain for the past 6 hours.  Pain is associated with shortness of breath, fatigue and headache.  EKG shows atrial fibrillation with rapid ventricular  response.  Patient is uncomfortable.  Vital signs stable.  Aspirin given in clinic.  Patient to the emergency department via EMS.  Today's evaluation has revealed signs of a dangerous process. Discussed diagnosis with patient and/or guardian. Patient and/or guardian comfortable with plan and disposition.  Patient and/or guardian has a clear mental status at this time, good insight into illness (after discussion and teaching) and has clear judgment to make decisions regarding their care  This care was provided during an unprecedented National Emergency due to the Novel Coronavirus (COVID-19) pandemic. COVID-19 infections and transmission risks place heavy strains on healthcare resources.  As this pandemic evolves, our facility, providers, and staff strive to respond fluidly, to remain operational, and to provide care relative to available resources and information. Outcomes are unpredictable and treatments are without well-defined guidelines. Further, the impact of COVID-19 on all aspects of urgent care, including the impact to patients seeking care for reasons other than COVID-19, is unavoidable during this national emergency. At this time of the global pandemic, management of patients has significantly changed, even for non-COVID positive patients given high local and regional COVID volumes at this time requiring high healthcare system and resource utilization. The standard of care for management of both COVID suspected and non-COVID suspected patients continues to change rapidly at the local, regional, national, and global levels. This patient was worked up and treated to the best available but ever changing evidence and resources available at this current time.   Documentation was completed with the aid of voice recognition software. Transcription may contain typographical errors.   Final Clinical Impressions(s) / UC Diagnoses   Final diagnoses:  Chest pain, unspecified type  Atrial fibrillation with  RVR [facility])     Discharge Instructions     You are being sent  to the emergency department via EMS due to your chest pain and abnormal heart rhythm     ED Prescriptions    None     PDMP not reviewed this encounter.   Lurline Idol, FNP 04/18/20 0838    Lurline Idol, FNP 04/18/20 [phone]

## 2020-04-18 NOTE — ED Notes (Signed)
Pt alert  Watching tv  No chest pain or sob   Pt waiting on room assignment

## 2020-04-18 NOTE — ED Notes (Signed)
Pt sleeping  meds infusing.  Pt waiting on admission bed.

## 2020-04-18 NOTE — ED Notes (Signed)
Iv meds started

## 2020-04-18 NOTE — ED Notes (Signed)
Patient is being discharged from the Urgent Care and sent to the Emergency Department via EMS . Per Lurline Idol, NP, patient is in need of higher level of care due to Atrial Fib. Patient is aware and verbalizes understanding of plan of care.  Vitals:   04/18/20 0818  BP: (!) 146/78  Pulse: 79  Resp: 18  Temp: 98.1 F (36.7 C)  SpO2: 100%

## 2020-04-18 NOTE — ED Notes (Signed)
fsbs 190 

## 2020-04-18 NOTE — Discharge Instructions (Signed)
You are being sent to the emergency department via EMS due to your chest pain and abnormal heart rhythm

## 2020-04-18 NOTE — H&P (Signed)
History and Physical    Dawn Farmer WUJ:[id number] DOB: [birth year] DOA: 04/18/2020  PCP: Sharyne Peach, MD   Patient coming from: Home  I have personally briefly reviewed patient's old medical records in [place]  Chief Complaint: Chest pain  HPI: Dawn Farmer is a 85 y.o. female with medical history significant for hypertension, diabetes mellitus, GERD and anxiety who was sent to the emergency room from the urgent care for evaluation of new onset atrial fibrillation with rapid ventricular rate and chest pain.  Patient had gone to the urgent care because of chest pain that woke her up out of her sleep at 2 AM this morning.  Pain is mostly midsternal, nonradiating and has been constant since it started.  She rates it a 7 x 10 in intensity at its worst and it is associated with SOB. She denies having any associated nausea, vomiting, diaphoresis, dizziness or lightheadedness.  She denies having any fever, no chills, no cough, no urinary symptoms or changes in her bowel habits. She has no known relieving or aggravating factors Labs revealed a sodium of 126, potassium of 3.6.  Initial troponin was 10  ED Course: Patient was seen in the emergency room for evaluation of new onset atrial fibrillation and chest pain.  She received 5 mg of IV metoprolol with transient improvement in heart rate.  Review of Systems: As per HPI otherwise 10 point review of systems negative.    Past Medical History:  Diagnosis Date  . Anxiety   . Arthritis   . Diabetes mellitus without complication ([facility])   . GERD (gastroesophageal reflux disease)   . Heart murmur   . Hypertension   . Peripheral neuropathy     Past Surgical History:  Procedure Laterality Date  . ABDOMINAL HYSTERECTOMY    . BALLOON DILATION N/A 04/23/2019   Procedure: BALLOON DILATION;  Surgeon: Manya Silvas, MD;  Location: [facility] ENDOSCOPY;  Service: Endoscopy;  Laterality: N/A;  . CARPAL TUNNEL RELEASE    .  CHOLECYSTECTOMY    . COLONOSCOPY WITH ESOPHAGOGASTRODUODENOSCOPY (EGD)    . ESOPHAGOGASTRODUODENOSCOPY (EGD) WITH PROPOFOL N/A 04/23/2019   Procedure: ESOPHAGOGASTRODUODENOSCOPY (EGD) WITH PROPOFOL;  Surgeon: Manya Silvas, MD;  Location: [facility] ENDOSCOPY;  Service: Endoscopy;  Laterality: N/A;  . rotator cuff tear Right 2016  . SHOULDER ARTHROSCOPY    . SHOULDER ARTHROSCOPY Right 04/06/2015   Procedure: right shoulder arthroscopy, arthroscopic labral debridement, subacromial decompression, mini open rotator cuff repair;  Surgeon: Corky Mull, MD;  Location: [facility] ORS;  Service: Orthopedics;  Laterality: Right;     reports that she has never smoked. She has never used smokeless tobacco. She reports that she does not drink alcohol and does not use drugs.  Allergies  Allergen Reactions  . Metformin And Related   . Reglan [Metoclopramide]     Family History  Problem Relation Age of Onset  . Cancer Mother   . Diabetes Mother   . Cancer Father   . Breast cancer Neg Hx      Prior to Admission medications   Medication Sig Start Date End Date Taking? Authorizing Provider  amLODipine (NORVASC) 10 MG tablet Take 10 mg by mouth daily.    [provider]  aspirin 81 MG tablet Take 81 mg by mouth daily.    [provider]  CALCIUM-VITAMIN D PO Take by mouth.    [provider]  gabapentin (NEURONTIN) 600 MG tablet Take 600 mg by mouth 2 (two) times daily.  [provider]  glipiZIDE (GLUCOTROL) 5 MG tablet Take 5 mg by mouth daily before breakfast.    [provider]  losartan (COZAAR) 50 MG tablet Take 50 mg by mouth every morning.    [provider]  lovastatin (MEVACOR) 20 MG tablet Take 20 mg by mouth daily at 12 noon.    [provider]  Multiple Vitamins-Minerals (CENTRUM SILVER ULTRA WOMENS PO) Take 1 capsule by mouth daily at 12 noon.    [provider]  OMEGA-3 KRILL OIL PO Take 1 capsule by mouth daily at 12  noon.    [provider]  omeprazole (PRILOSEC) 40 MG capsule Take 40 mg by mouth 2 (two) times daily.    [provider]  predniSONE (STERAPRED UNI-PAK 21 TAB) 10 MG (21) TBPK tablet 6 tablets on day 1; decrease by 1 tablet daily until gone. 04/01/20   Tommie Sams, DO  traMADol (ULTRAM) 50 MG tablet Take 1 tablet (50 mg total) by mouth every 8 (eight) hours as needed for moderate pain or severe pain. 04/01/20   Tommie Sams, DO    Physical Exam: Vitals:   04/18/20 0936 04/18/20 0938 04/18/20 1223  BP: 110/60  107/71  Pulse: (!) 120  (!) 105  Resp: (!) 25  16  Temp: 98.3 F (36.8 C)    TempSrc: Oral    SpO2: 95%  95%  Weight:  62.6 kg   Height:  5\' 6"  (1.676 m)      Vitals:   04/18/20 0936 04/18/20 0938 04/18/20 1223  BP: 110/60  107/71  Pulse: (!) 120  (!) 105  Resp: (!) 25  16  Temp: 98.3 F (36.8 C)    TempSrc: Oral    SpO2: 95%  95%  Weight:  62.6 kg   Height:  5\' 6"  (1.676 m)     Constitutional: NAD, alert and oriented x 3. Anxious Eyes: PERRL, lids and conjunctivae normal ENMT: Mucous membranes are moist.  Neck: normal, supple, no masses, no thyromegaly Respiratory: clear to auscultation bilaterally, no wheezing, no crackles. Normal respiratory effort. No accessory muscle use.  Cardiovascular: Irregularly irregular, tachycardic no murmurs / rubs / gallops. No extremity edema. 2+ pedal pulses. No carotid bruits.  Abdomen: no tenderness, no masses palpated. No hepatosplenomegaly. Bowel sounds positive.  Musculoskeletal: no clubbing / cyanosis. No joint deformity upper and lower extremities.  Skin: no rashes, lesions, ulcers.  Neurologic: No gross focal neurologic deficit. Psychiatric: Normal mood and affect.   Labs on Admission: I have personally reviewed following labs and imaging studies  CBC: Recent Labs  Lab 04/18/20 1120  WBC 11.0*  NEUTROABS 8.3*  HGB 11.7*  HCT 32.7*  MCV 83.4  PLT 157   Basic Metabolic Panel: Recent Labs    Lab 04/18/20 1120  NA 126*  K 3.6  CL 94*  CO2 23  GLUCOSE 176*  BUN 15  CREATININE 1.02*  CALCIUM 8.3*   GFR: Estimated Creatinine Clearance: 39.8 mL/min (A) (by C-G formula based on SCr of 1.02 mg/dL (H)). Liver Function Tests: No results for input(s): AST, ALT, ALKPHOS, BILITOT, PROT, ALBUMIN in the last 168 hours. No results for input(s): LIPASE, AMYLASE in the last 168 hours. No results for input(s): AMMONIA in the last 168 hours. Coagulation Profile: No results for input(s): INR, PROTIME in the last 168 hours. Cardiac Enzymes: No results for input(s): CKTOTAL, CKMB, CKMBINDEX, TROPONINI in the last 168 hours. BNP (last 3 results) No results for input(s): PROBNP in  the last 8760 hours. HbA1C: No results for input(s): HGBA1C in the last 72 hours. CBG: No results for input(s): GLUCAP in the last 168 hours. Lipid Profile: No results for input(s): CHOL, HDL, LDLCALC, TRIG, CHOLHDL, LDLDIRECT in the last 72 hours. Thyroid Function Tests: No results for input(s): TSH, T4TOTAL, FREET4, T3FREE, THYROIDAB in the last 72 hours. Anemia Panel: No results for input(s): VITAMINB12, FOLATE, FERRITIN, TIBC, IRON, RETICCTPCT in the last 72 hours. Urine analysis:    Component Value Date/Time   COLORURINE YELLOW 08/22/2014 1128   APPEARANCEUR HAZY 08/22/2014 1128   LABSPEC 1.010 08/22/2014 1128   PHURINE 6.5 08/22/2014 1128   GLUCOSEU NEGATIVE 08/22/2014 1128   HGBUR SMALL 08/22/2014 1128   BILIRUBINUR NEGATIVE 08/22/2014 1128   KETONESUR NEGATIVE 08/22/2014 1128   PROTEINUR NEGATIVE 08/22/2014 1128   NITRITE NEGATIVE 08/22/2014 1128   LEUKOCYTESUR LARGE 08/22/2014 1128    Radiological Exams on Admission: No results found.  EKG: Independently reviewed.  Atrial fibrillation  Assessment/Plan Principal Problem:   Atrial fibrillation with RVR (HCC) Active Problems:   Hyponatremia   Diabetes mellitus without complication (HCC)   Hypertension     Atrial fibrillation  with rapid ventricular rate New onset Patient's rate improved transiently following administration of IV metoprolol.  Will place patient on Cardizem drip We will place patient on metoprolol 50 mg daily Obtain 2D echocardiogram to assess LVEF and rule out valvular pathology Obtain TSH levels Patient has a CHADS2VASC score of 5 and ideally requires long-term anticoagulation as primary prophylaxis for an acute stroke Will request cardiology consult   Hyponatremia Unclear etiology Judicious IV fluid hydration Repeat sodium levels in a.m.   Hypertension Blood pressures well controlled Continue losartan   Diabetes mellitus Optimize glycemic control Hold oral hypoglycemic agents while patient is hospitalized Place patient on consistent carbohydrate diet Sliding scale coverage with insulin   DVT prophylaxis: Lovenox Code Status: Full  Family Communication: Greater than 50% of time was spent discussing plan of care with patient at the bedside she verbalizes understanding and agrees with the plan. Disposition Plan: Back to previous home environment Consults called: Cardiology    Elita Dame MD [facility]     04/18/2020, 2:01 PM  The

## 2020-04-18 NOTE — Consult Note (Signed)
Cardiology Consultation Note    Patient ID: Dawn Farmer, MRN: [id number], DOB/AGE: [birth year] 85 y.o. Admit date: 04/18/2020   Date of Consult: 04/18/2020 Primary Physician: Rayetta Humphrey, MD Primary Cardiologist: none  Chief Complaint: rapid heart rate and sob. With chest tightness.  Reason for Consultation: afib Requesting MD: agbata  HPI: Dawn Farmer is a 85 y.o. female with history of diabetes, hypertension, peripheral neuropathy but no cardiac history and has never seen a cardiac physician.  She presented to the emergency room with sensation of a rapid heart rate as well as chest heaviness.  She has not had this previously.  She does not drink alcohol.  She does not smoke.  She does not drink excessive caffeine.  EKG revealed atrial fibrillation with rapid ventricular response.  Laboratories revealed normal TSH.  High-sensitivity troponin was normal x2.  Serum creatinine was near her baseline at 1.  Potassium was normal.  Hemoglobin hematocrit were normal.  Echocardiogram is pending.  She was given IV Cardizem x1 and a Cardizem drip was ordered.  She also is on metoprolol succinate 50 mg daily.  She is on pravastatin 10 mg daily.  Her CHA2DS2-VASc score is 5.  She has no excessive bleeding risk.  Past Medical History:  Diagnosis Date  . Anxiety   . Arthritis   . Diabetes mellitus without complication (HCC)   . GERD (gastroesophageal reflux disease)   . Heart murmur   . Hypertension   . Peripheral neuropathy       Surgical History:  Past Surgical History:  Procedure Laterality Date  . ABDOMINAL HYSTERECTOMY    . BALLOON DILATION N/A 04/23/2019   Procedure: BALLOON DILATION;  Surgeon: Scot Jun, MD;  Location: [facility] ENDOSCOPY;  Service: Endoscopy;  Laterality: N/A;  . CARPAL TUNNEL RELEASE    . CHOLECYSTECTOMY    . COLONOSCOPY WITH ESOPHAGOGASTRODUODENOSCOPY (EGD)    . ESOPHAGOGASTRODUODENOSCOPY (EGD) WITH PROPOFOL N/A 04/23/2019   Procedure:  ESOPHAGOGASTRODUODENOSCOPY (EGD) WITH PROPOFOL;  Surgeon: Scot Jun, MD;  Location: [facility] ENDOSCOPY;  Service: Endoscopy;  Laterality: N/A;  . rotator cuff tear Right 2016  . SHOULDER ARTHROSCOPY    . SHOULDER ARTHROSCOPY Right 04/06/2015   Procedure: right shoulder arthroscopy, arthroscopic labral debridement, subacromial decompression, mini open rotator cuff repair;  Surgeon: Christena Flake, MD;  Location: [facility] ORS;  Service: Orthopedics;  Laterality: Right;     Home Meds: Prior to Admission medications   Medication Sig Start Date End Date Taking? Authorizing Provider  amLODipine (NORVASC) 5 MG tablet Take 5 mg by mouth daily. 04/11/20   [provider]  aspirin 81 MG tablet Take 81 mg by mouth daily.    [provider]  gabapentin (NEURONTIN) 600 MG tablet Take 600 mg by mouth 2 (two) times daily.    [provider]  glipiZIDE (GLUCOTROL XL) 5 MG 24 hr tablet Take 5 mg by mouth 2 (two) times daily. 01/20/20   [provider]  losartan-hydrochlorothiazide (HYZAAR) 100-12.5 MG tablet Take 1 tablet by mouth daily. 04/11/20   [provider]  lovastatin (MEVACOR) 20 MG tablet Take 20 mg by mouth daily at 12 noon.    [provider]  Multiple Vitamins-Minerals (CENTRUM SILVER ULTRA WOMENS PO) Take 1 capsule by mouth daily at 12 noon.    [provider]  OMEGA-3 KRILL OIL PO Take 1 capsule by mouth daily at 12 noon.    [provider]  omeprazole (PRILOSEC) 40 MG capsule Take 40  mg by mouth 2 (two) times daily.    [provider]  predniSONE (DELTASONE) 20 MG tablet Take 20 mg by mouth 2 (two) times daily. 04/11/20   [provider]  traMADol (ULTRAM) 50 MG tablet Take 1 tablet (50 mg total) by mouth every 8 (eight) hours as needed for moderate pain or severe pain. 04/01/20   Tommie Sams, DO    Inpatient Medications:  . aspirin  324 mg Oral NOW   Or  . aspirin  300 mg Rectal NOW  . aspirin EC  81 mg Oral  Daily  . calcium-vitamin D  1 tablet Oral Daily  . enoxaparin (LOVENOX) injection  40 mg Subcutaneous Q24H  . gabapentin  600 mg Oral BID  . insulin aspart  0-15 Units Subcutaneous TID WC  . [START ON 04/19/2020] losartan  50 mg Oral BH-q7a  . metoprolol succinate  50 mg Oral Daily  . multivitamin with minerals  1 tablet Oral Q1200  . omega-3 acid ethyl esters  1 capsule Oral Q1200  . pantoprazole  40 mg Oral Daily  . pravastatin  10 mg Oral q1800   . sodium chloride      Allergies:  Allergies  Allergen Reactions  . Metformin And Related   . Reglan [Metoclopramide]     Social History   Socioeconomic History  . Marital status: Widowed    Spouse name: Not on file  . Number of children: Not on file  . Years of education: Not on file  . Highest education level: Not on file  Occupational History  . Not on file  Tobacco Use  . Smoking status: Never Smoker  . Smokeless tobacco: Never Used  Vaping Use  . Vaping Use: Never used  Substance and Sexual Activity  . Alcohol use: No  . Drug use: No  . Sexual activity: Not on file  Other Topics Concern  . Not on file  Social History Narrative  . Not on file   Social Determinants of Health   Financial Resource Strain:   . Difficulty of Paying Living Expenses:   Food Insecurity:   . Worried About Programme researcher, broadcasting/film/video in the Last Year:   . Barista in the Last Year:   Transportation Needs:   . Freight forwarder (Medical):   Marland Kitchen Lack of Transportation (Non-Medical):   Physical Activity:   . Days of Exercise per Week:   . Minutes of Exercise per Session:   Stress:   . Feeling of Stress :   Social Connections:   . Frequency of Communication with Friends and Family:   . Frequency of Social Gatherings with Friends and Family:   . Attends Religious Services:   . Active Member of Clubs or Organizations:   . Attends Banker Meetings:   Marland Kitchen Marital Status:   Intimate Partner Violence:   . Fear of Current or  Ex-Partner:   . Emotionally Abused:   Marland Kitchen Physically Abused:   . Sexually Abused:      Family History  Problem Relation Age of Onset  . Cancer Mother   . Diabetes Mother   . Cancer Father   . Breast cancer Neg Hx      Review of Systems: A 12-system review of systems was performed and is negative except as noted in the HPI.  Labs: No results for input(s): CKTOTAL, CKMB, TROPONINI in the last 72 hours. Lab Results  Component Value Date   WBC 11.0 (H)  04/18/2020   HGB 11.7 (L) 04/18/2020   HCT 32.7 (L) 04/18/2020   MCV 83.4 04/18/2020   PLT 157 04/18/2020    Recent Labs  Lab 04/18/20 1120  NA 126*  K 3.6  CL 94*  CO2 23  BUN 15  CREATININE 1.02*  CALCIUM 8.3*  GLUCOSE 176*   No results found for: CHOL, HDL, LDLCALC, TRIG No results found for: DDIMER  Radiology/Studies:  DG Lumbar Spine Complete  Result Date: 04/01/2020 CLINICAL DATA:  Right hip pain EXAM: LUMBAR SPINE - COMPLETE 4+ VIEW COMPARISON:  None. FINDINGS: Five lumbar type vertebral bodies are well visualized. Vertebral body height is well maintained. Multilevel disc space narrowing and osteophytic changes are seen. No anterolisthesis is noted. Facet hypertrophic changes are seen. No pars defects are noted. No soft tissue abnormality is noted. IMPRESSION: Multilevel degenerative change without acute abnormality. Electronically Signed   By: Inez Catalina M.D.   On: 04/01/2020 09:07   DG Hip Unilat W or Wo Pelvis 2-3 Views Right  Result Date: 04/01/2020 CLINICAL DATA:  Right hip pain EXAM: DG HIP (WITH OR WITHOUT PELVIS) 2-3V RIGHT COMPARISON:  None. FINDINGS: Alignment is anatomic. There is no acute fracture. Joint space is preserved. Sacroiliac joints are unremarkable. IMPRESSION: Negative. Electronically Signed   By: Macy Mis M.D.   On: 04/01/2020 09:07    Wt Readings from Last 3 Encounters:  04/18/20 62.6 kg  04/01/20 58.5 kg  07/04/19 62.6 kg    EKG: Atrial fibrillation with rapid ventricular  response  Physical Exam:  Blood pressure 107/71, pulse (!) 105, temperature 98.3 F (36.8 C), temperature source Oral, resp. rate 16, height 5\' 6"  (1.676 m), weight 62.6 kg, SpO2 95 %. Body mass index is 22.27 kg/m. General: Well developed, well nourished, in no acute distress. Head: Normocephalic, atraumatic, sclera non-icteric, no xanthomas, nares are without discharge.  Neck: Negative for carotid bruits. JVD not elevated. Lungs: Clear bilaterally to auscultation without wheezes, rales, or rhonchi. Breathing is unlabored. Heart: Rapid irregular heartbeat.. Abdomen: Soft, non-tender, non-distended with normoactive bowel sounds. No hepatomegaly. No rebound/guarding. No obvious abdominal masses. Msk:  Strength and tone appear normal for age. Extremities: No clubbing or cyanosis. No edema.  Distal pedal pulses are 2+ and equal bilaterally. Neuro: Alert and oriented X 3. No facial asymmetry. No focal deficit. Moves all extremities spontaneously. Psych:  Responds to questions appropriately with a normal affect.     Assessment and Plan  85 year old female with history of hypertension, diabetes but no cardiac history who presented to emergency room with sensation of rapid heart rate as well as chest pressure.  She was noted to be in atrial fibrillation with rapid ventricular response.  She has not noted this before.  She has made no medication changes.  She does not drink alcohol.  She has ruled out for myocardial infarction is hemodynamically stable.  Cardizem drip has been ordered.  She is on metoprolol succinate 50 mg daily.  She is also on losartan for hypertension.  1.  Atrial fibrillation-new onset apparently.  Would agree with IV Cardizem drip to help control rate.  Chads 2 vascular is 5.  Patient is ruled out for myocardial infarction.  Will place on Eliquis 5 mg twice daily as her weight is greater than 60 kg and a creatinine is less than 1.5.  Echocardiogram is pending.  This will be more  accurate when rate is in better control.  2.  Hypertension-continue with losartan and metoprolol.  Will also follow  blood pressure on Cardizem drip.  3.  Diabetes-continue with current therapy.  4.  Hyperlipidemia.  Continue with pravastatin at 10 mg daily.  We will follow with you and further recommendations based on hospital course.  Signed, Dalia Heading MD 04/18/2020, 3:24 PM Pager: [phone]

## 2020-04-19 ENCOUNTER — Observation Stay
Admit: 2020-04-19 | Discharge: 2020-04-19 | Disposition: A | Discharge status: Home / Self Care | Payer: Medicare HMO | Attending: Internal Medicine | Admitting: Internal Medicine

## 2020-04-19 ENCOUNTER — Observation Stay: Payer: Medicare HMO

## 2020-04-19 DIAGNOSIS — E871 Hypo-osmolality and hyponatremia: Secondary | ICD-10-CM

## 2020-04-19 DIAGNOSIS — Z833 Family history of diabetes mellitus: Secondary | ICD-10-CM | POA: Diagnosis not present

## 2020-04-19 DIAGNOSIS — E1142 Type 2 diabetes mellitus with diabetic polyneuropathy: Secondary | ICD-10-CM | POA: Diagnosis present

## 2020-04-19 DIAGNOSIS — I4891 Unspecified atrial fibrillation: Secondary | ICD-10-CM | POA: Diagnosis present

## 2020-04-19 DIAGNOSIS — I10 Essential (primary) hypertension: Secondary | ICD-10-CM | POA: Diagnosis present

## 2020-04-19 DIAGNOSIS — K219 Gastro-esophageal reflux disease without esophagitis: Secondary | ICD-10-CM | POA: Diagnosis present

## 2020-04-19 DIAGNOSIS — Z79899 Other long term (current) drug therapy: Secondary | ICD-10-CM | POA: Diagnosis not present

## 2020-04-19 DIAGNOSIS — Z20822 Contact with and (suspected) exposure to covid-19: Secondary | ICD-10-CM | POA: Diagnosis present

## 2020-04-19 DIAGNOSIS — Z90710 Acquired absence of both cervix and uterus: Secondary | ICD-10-CM | POA: Diagnosis not present

## 2020-04-19 DIAGNOSIS — D696 Thrombocytopenia, unspecified: Secondary | ICD-10-CM | POA: Diagnosis not present

## 2020-04-19 DIAGNOSIS — F419 Anxiety disorder, unspecified: Secondary | ICD-10-CM | POA: Diagnosis present

## 2020-04-19 DIAGNOSIS — E785 Hyperlipidemia, unspecified: Secondary | ICD-10-CM | POA: Diagnosis present

## 2020-04-19 DIAGNOSIS — Z7984 Long term (current) use of oral hypoglycemic drugs: Secondary | ICD-10-CM | POA: Diagnosis not present

## 2020-04-19 DIAGNOSIS — Z888 Allergy status to other drugs, medicaments and biological substances status: Secondary | ICD-10-CM | POA: Diagnosis not present

## 2020-04-19 DIAGNOSIS — M1990 Unspecified osteoarthritis, unspecified site: Secondary | ICD-10-CM | POA: Diagnosis present

## 2020-04-19 DIAGNOSIS — J9811 Atelectasis: Secondary | ICD-10-CM | POA: Diagnosis present

## 2020-04-19 DIAGNOSIS — Z7982 Long term (current) use of aspirin: Secondary | ICD-10-CM | POA: Diagnosis not present

## 2020-04-19 DIAGNOSIS — E119 Type 2 diabetes mellitus without complications: Secondary | ICD-10-CM | POA: Diagnosis not present

## 2020-04-19 LAB — ECHOCARDIOGRAM COMPLETE
Height: 66 in
Weight: 2208 oz

## 2020-04-19 LAB — BASIC METABOLIC PANEL
Anion gap: 9 (ref 5–15)
BUN: 15 mg/dL (ref 8–23)
CO2: 22 mmol/L (ref 22–32)
Calcium: 8.1 mg/dL — ABNORMAL LOW (ref 8.9–10.3)
Chloride: 98 mmol/L (ref 98–111)
Creatinine, Ser: 1.01 mg/dL — ABNORMAL HIGH (ref 0.44–1.00)
GFR calc Af Amer: >60 mL/min (ref >60)
GFR calc non Af Amer: 52 mL/min — ABNORMAL LOW (ref >60)
Glucose, Bld: 184 mg/dL — ABNORMAL HIGH (ref 70–99)
Potassium: 4.2 mmol/L (ref 3.5–5.1)
Sodium: 129 mmol/L — ABNORMAL LOW (ref 135–145)

## 2020-04-19 LAB — CBC
HCT: 30.6 % — ABNORMAL LOW (ref 36.0–46.0)
Hemoglobin: 11 g/dL — ABNORMAL LOW (ref 12.0–15.0)
MCH: 30.1 pg (ref 26.0–34.0)
MCHC: 35.9 g/dL (ref 30.0–36.0)
MCV: 83.8 fL (ref 80.0–100.0)
Platelets: 149 10*3/uL — ABNORMAL LOW (ref 150–400)
RBC: 3.65 MIL/uL — ABNORMAL LOW (ref 3.87–5.11)
RDW: 12.7 % (ref 11.5–15.5)
WBC: 10.3 10*3/uL (ref 4.0–10.5)
nRBC: 0 % (ref 0.0–0.2)

## 2020-04-19 LAB — GLUCOSE, CAPILLARY
Glucose-Capillary: 142 mg/dL — ABNORMAL HIGH (ref 70–99)
Glucose-Capillary: 121 mg/dL — ABNORMAL HIGH (ref 70–99)
Glucose-Capillary: 158 mg/dL — ABNORMAL HIGH (ref 70–99)
Glucose-Capillary: 211 mg/dL — ABNORMAL HIGH (ref 70–99)

## 2020-04-19 MED ORDER — IPRATROPIUM-ALBUTEROL 0.5-2.5 (3) MG/3ML IN SOLN
3.0000 mL | RESPIRATORY_TRACT | Status: DC | PRN
  Administered 2020-04-19: 3 mL via RESPIRATORY_TRACT
  Filled 2020-04-19: qty 3

## 2020-04-19 NOTE — ED Notes (Signed)
Report off to vanessa rn 

## 2020-04-19 NOTE — ED Notes (Signed)
Serenity RN at bedside to do admission profile then transport patient upstairs.

## 2020-04-19 NOTE — Progress Notes (Signed)
*  PRELIMINARY RESULTS* Echocardiogram 2D Echocardiogram has been performed.  Cristela Blue 04/19/2020, 8:51 AM

## 2020-04-19 NOTE — ED Notes (Signed)
Medications administered per order. Pt tolerated well. Pt given cup of coffee per her request.

## 2020-04-19 NOTE — Progress Notes (Signed)
[facility]    SUBJECTIVE: Dawn Farmer is an 85 year old female with a past medical history significant for type 2 diabetes, hypertension, and hyperlipidemia who presented to the ED on 04/18/20 for chest pain/palpitations. ECG revealed new onset atrial fibrillation with RVR, rate in the 130s. Blood work was significant for sodium of 126, creatinine of 1.02, and high sensitivity troponin negative x 2.  She was started on Cardizem infusion as well as Eliquis 5mg  BID.   Since admission, Dawn Farmer reports significant improvement in shortness of breath, but continues to experience intermittent chest fluttering and chest pressure.  She denies lower extremity swelling, orthopnea, or PND. She is currently in NSR.    Vitals:   04/19/20 0644 04/19/20 0700 04/19/20 0820 04/19/20 0840  BP: 100/65 112/64 131/67 124/65  Pulse: 94 83 99 (!) 101  Resp: 18 (!) 26 (!) 25 19  Temp:      TempSrc:      SpO2: 94% 91% 98% 98%  Weight:      Height:        No intake or output data in the 24 hours ending 04/19/20 0902    PHYSICAL EXAM  General: Well developed, well nourished, in no acute distress HEENT:  Normocephalic and atramatic Neck:  No JVD.  Lungs: On supplemental O2. Clear bilaterally to auscultation and percussion. Heart: Tachycardic, regular rhythm . Normal S1 and S2 without gallops or murmurs.  Abdomen: Bowel sounds are positive, abdomen soft and non-tender  Msk:  Back normal, normal gait. Normal strength and tone for age. Extremities: No clubbing, cyanosis or edema.   Neuro: Alert and oriented X 3. Psych:  Good affect, responds appropriately   LABS: Basic Metabolic Panel: Recent Labs    04/18/20 1120 04/19/20 0716  NA 126* 129*  K 3.6 4.2  CL 94* 98  CO2 23 22  GLUCOSE 176* 184*  BUN 15 15  CREATININE 1.02* 1.01*  CALCIUM 8.3* 8.1*   Liver Function Tests: No results for input(s): AST, ALT, ALKPHOS, BILITOT, PROT, ALBUMIN in the last 72 hours. No results for input(s):  LIPASE, AMYLASE in the last 72 hours. CBC: Recent Labs    04/18/20 1120 04/19/20 0716  WBC 11.0* 10.3  NEUTROABS 8.3*  --   HGB 11.7* 11.0*  HCT 32.7* 30.6*  MCV 83.4 83.8  PLT 157 149*   Cardiac Enzymes: No results for input(s): CKTOTAL, CKMB, CKMBINDEX, TROPONINI in the last 72 hours. BNP: Invalid input(s): POCBNP D-Dimer: No results for input(s): DDIMER in the last 72 hours. Hemoglobin A1C: Recent Labs    04/18/20 1536  HGBA1C 7.8*   Fasting Lipid Panel: No results for input(s): CHOL, HDL, LDLCALC, TRIG, CHOLHDL, LDLDIRECT in the last 72 hours. Thyroid Function Tests: Recent Labs    04/18/20 1536  TSH 2.147   Anemia Panel: No results for input(s): VITAMINB12, FOLATE, FERRITIN, TIBC, IRON, RETICCTPCT in the last 72 hours.  No results found.   Echo: Pending   TELEMETRY: Normal sinus rhythm, rate in the low 100s   ASSESSMENT AND PLAN:  Principal Problem:   Atrial fibrillation with RVR (HCC) Active Problems:   Hyponatremia   Diabetes mellitus without complication (HCC)   Hypertension   Atrial fibrillation with rapid ventricular response ([place])    1.  New onset atrial fibrillation with RVR   -Currently in normal sinus rhythm; will remain on diltiazem infusion for now, and will likely transition to oral diltiazem this afternoon   -Will continue metoprolol 50mg  once daily   -  Continue Eliquis 5mg  BID with a CHA2Ds VASc score of 5   -Echocardiogram pending   2.  Hypertension   -With recent episodes of probable orthostatic hypotension   -Will remain off of HCTZ and continue with lower dose of losartan 50mg  daily  -Continue metoprolol 50mg  daily; will transition to oral diltiazem when warranted   3.  Hyponatremia   -Unclear etiology; has improved with IV fluids   The history, physical exam findings, and plan of care were all discussed with Dr. , and all decision making was made in collaboration.     PA-C 04/19/2020 9:02  AM

## 2020-04-19 NOTE — ED Notes (Signed)
Meds administered per order. Pt tolerated well. Lights dimmed for patient comfort. Pt denies further needs. Call bell remains within reach at this time.

## 2020-04-19 NOTE — ED Notes (Signed)
Admitting MD made aware of patient's HR 100-109 ST after Duoneb treatment, per admitting MD, do not increase diltiazem drip. Pt HR noted to be decreasing at this time. Medications administered per order, CBG obtained by this RN. Pt tolerated well. Pt denies any needs. Call bell within reach. Pt with continued dry cough noted at this time. Pt noted to be sitting in bed watching TV and reading intermittently.

## 2020-04-19 NOTE — Progress Notes (Signed)
PROGRESS NOTE    Dawn Farmer  BEM:[id number] DOB: 09-Nov-1934 DOA: 04/18/2020 PCP: Rayetta Humphrey, MD    Assessment & Plan:   Principal Problem:   Atrial fibrillation with RVR (HCC) Active Problems:   Hyponatremia   Diabetes mellitus without complication (HCC)   Hypertension   Atrial fibrillation with rapid ventricular response (HCC)  Atrial fibrillation: w/ RVR. New onset. Continue on IV cardizem drip & po metoprolol. Echo shows EF 60-65%, normal diastolic function. CHADS2VASC score of 5 and ideally requires long-term anticoagulation as primary prophylaxis for an acute stroke. Cardio following and recs apprec  Hyponatremia: unclear etiology. Improving w/ IVFs. Will continue to monitor Na levels   Hypertension: continue on metoprolol, losartan   DM2: continue on SSI w/ accuchecks   Thrombocytopenia: etiology unclear. Mild. Will continue to monitor     DVT prophylaxis: eliquis  Code Status: full  Family Communication:  Disposition Plan: depends on PT/OT recs. Unlikely any barriers    Consultants:   cardio   Procedures:    Antimicrobials:    Subjective: Pt c/o fatigue but wants to go home   Objective: Vitals:   04/19/20 0500 04/19/20 0644 04/19/20 0700 04/19/20 0820  BP: (!) 82/47 100/65 112/64 131/67  Pulse:  94 83 99  Resp: (!) 24 18 (!) 26 (!) 25  Temp:      TempSrc:      SpO2:  94% 91% 98%  Weight:      Height:       No intake or output data in the 24 hours ending 04/19/20 0828 Filed Weights   04/18/20 0938  Weight: 62.6 kg    Examination:  General exam: Appears calm and comfortable  Respiratory system: Clear to auscultation. No rales, rhonchi  Cardiovascular system: Irregularly irregular. No rubs, gallops or clicks. Gastrointestinal system: Abdomen is nondistended, soft and nontender. Normal bowel sounds heard. Central nervous system: Alert and oriented. Moves all 4 extremities  Psychiatry: Judgement and insight appear normal. Flat  mood and affect      Data Reviewed: I have personally reviewed following labs and imaging studies  CBC: Recent Labs  Lab 04/18/20 1120 04/19/20 0716  WBC 11.0* 10.3  NEUTROABS 8.3*  --   HGB 11.7* 11.0*  HCT 32.7* 30.6*  MCV 83.4 83.8  PLT 157 149*   Basic Metabolic Panel: Recent Labs  Lab 04/18/20 1120 04/19/20 0716  NA 126* 129*  K 3.6 4.2  CL 94* 98  CO2 23 22  GLUCOSE 176* 184*  BUN 15 15  CREATININE 1.02* 1.01*  CALCIUM 8.3* 8.1*   GFR: Estimated Creatinine Clearance: 40.2 mL/min (A) (by C-G formula based on SCr of 1.01 mg/dL (H)). Liver Function Tests: No results for input(s): AST, ALT, ALKPHOS, BILITOT, PROT, ALBUMIN in the last 168 hours. No results for input(s): LIPASE, AMYLASE in the last 168 hours. No results for input(s): AMMONIA in the last 168 hours. Coagulation Profile: No results for input(s): INR, PROTIME in the last 168 hours. Cardiac Enzymes: No results for input(s): CKTOTAL, CKMB, CKMBINDEX, TROPONINI in the last 168 hours. BNP (last 3 results) No results for input(s): PROBNP in the last 8760 hours. HbA1C: Recent Labs    04/18/20 1536  HGBA1C 7.8*   CBG: Recent Labs  Lab 04/18/20 1955 04/19/20 0810  GLUCAP 190* 142*   Lipid Profile: No results for input(s): CHOL, HDL, LDLCALC, TRIG, CHOLHDL, LDLDIRECT in the last 72 hours. Thyroid Function Tests: Recent Labs    04/18/20 1536  TSH 2.147  Anemia Panel: No results for input(s): VITAMINB12, FOLATE, FERRITIN, TIBC, IRON, RETICCTPCT in the last 72 hours. Sepsis Labs: No results for input(s): PROCALCITON, LATICACIDVEN in the last 168 hours.  Recent Results (from the past 240 hour(s))  SARS Coronavirus 2 by RT PCR (hospital order, performed in [facility] hospital lab) Nasopharyngeal Nasopharyngeal Swab     Status: None   Collection Time: 04/18/20  1:00 PM   Specimen: Nasopharyngeal Swab  Result Value Ref Range Status   SARS Coronavirus 2 NEGATIVE NEGATIVE Final    Comment:  (NOTE) SARS-CoV-2 target nucleic acids are NOT DETECTED.  The SARS-CoV-2 RNA is generally detectable in upper and lower respiratory specimens during the acute phase of infection. The lowest concentration of SARS-CoV-2 viral copies this assay can detect is 250 copies / mL. A negative result does not preclude SARS-CoV-2 infection and should not be used as the sole basis for treatment or other patient management decisions.  A negative result may occur with improper specimen collection / handling, submission of specimen other than nasopharyngeal swab, presence of viral mutation(s) within the areas targeted by this assay, and inadequate number of viral copies (<250 copies / mL). A negative result must be combined with clinical observations, patient history, and epidemiological information.  Fact Sheet for Patients:   StrictlyIdeas.no  Fact Sheet for Healthcare Providers: BankingDealers.co.za  This test is not yet approved or  cleared by the [place] FDA and has been authorized for detection and/or diagnosis of SARS-CoV-2 by FDA under an Emergency Use Authorization (EUA).  This EUA will remain in effect (meaning this test can be used) for the duration of the COVID-19 declaration under Section 564(b)(1) of the Act, 21 U.S.C. section 360bbb-3(b)(1), unless the authorization is terminated or revoked sooner.  Performed at [facility], [address] [place], [place] [address]          Radiology Studies: No results found.      Scheduled Meds: . apixaban  5 mg Oral BID  . aspirin EC  81 mg Oral Daily  . calcium-vitamin D  1 tablet Oral Daily  . gabapentin  600 mg Oral BID  . insulin aspart  0-15 Units Subcutaneous TID WC  . losartan  50 mg Oral BH-q7a  . metoprolol succinate  50 mg Oral Daily  . multivitamin with minerals  1 tablet Oral Q1200  . omega-3 acid ethyl esters  1 capsule Oral Q1200  . pantoprazole  40  mg Oral Daily  . pravastatin  10 mg Oral q1800   Continuous Infusions: . sodium chloride 100 mL/hr at 04/18/20 2328  . diltiazem (CARDIZEM) infusion 5 mg/hr (04/18/20 1627)     LOS: 0 days    Time spent: 33 mins     Wyvonnia Dusky, MD [facility] Pager [phone]  If 7PM-7AM, please contact night-coverage [email or web address] 04/19/2020, 8:28 AM

## 2020-04-19 NOTE — ED Notes (Signed)
Pt given meal tray, continues to do breathing treatment at this time.

## 2020-04-19 NOTE — ED Notes (Signed)
Pt sitting in bed, currently eating a banana. Pt with noted dry cough at this time. Denies any needs. Call bell within reach.

## 2020-04-19 NOTE — ED Notes (Signed)
Pt given meal tray at this time 

## 2020-04-19 NOTE — ED Notes (Signed)
CBG obtained by this RN, medications administered. Pt tolerated well. Pt denies any needs at this time. Pt visualized in NAD. VSS at this time.

## 2020-04-19 NOTE — ED Notes (Signed)
Admitting MD at bedside at this time.

## 2020-04-19 NOTE — ED Notes (Signed)
Pt assisted to the bathroom by Joni Reining, EDT. Pt with dyspnea with exertion upon arrival back to bed. VSS however patient noted to be tachypneic on arrival back to bed. This RN called pharmacy requesting another bag of diltiazem due to patient being almost out, awaiting dose from pharmacy.

## 2020-04-19 NOTE — ED Notes (Signed)
This RN to bedside, introduced self to patient. Pt visualized in NAD at this time. Repeat bloodwork collected by this RN. Pt tolerated well. Pt denies any needs at this time. Pt A&O x4. Call bell within reach of patient at this time.

## 2020-04-19 NOTE — ED Notes (Signed)
Pt helped up to bathroom and back to bed.

## 2020-04-20 LAB — BASIC METABOLIC PANEL
Anion gap: 7 (ref 5–15)
BUN: 12 mg/dL (ref 8–23)
CO2: 24 mmol/L (ref 22–32)
Calcium: 8.2 mg/dL — ABNORMAL LOW (ref 8.9–10.3)
Chloride: 103 mmol/L (ref 98–111)
Creatinine, Ser: 0.88 mg/dL (ref 0.44–1.00)
GFR calc Af Amer: >60 mL/min (ref >60)
GFR calc non Af Amer: >60 mL/min (ref >60)
Glucose, Bld: 148 mg/dL — ABNORMAL HIGH (ref 70–99)
Potassium: 4.1 mmol/L (ref 3.5–5.1)
Sodium: 134 mmol/L — ABNORMAL LOW (ref 135–145)

## 2020-04-20 LAB — CBC
HCT: 30.1 % — ABNORMAL LOW (ref 36.0–46.0)
Hemoglobin: 10.8 g/dL — ABNORMAL LOW (ref 12.0–15.0)
MCH: 30.3 pg (ref 26.0–34.0)
MCHC: 35.9 g/dL (ref 30.0–36.0)
MCV: 84.6 fL (ref 80.0–100.0)
Platelets: 164 10*3/uL (ref 150–400)
RBC: 3.56 MIL/uL — ABNORMAL LOW (ref 3.87–5.11)
RDW: 12.7 % (ref 11.5–15.5)
WBC: 7.7 10*3/uL (ref 4.0–10.5)
nRBC: 0 % (ref 0.0–0.2)

## 2020-04-20 LAB — GLUCOSE, CAPILLARY
Glucose-Capillary: 139 mg/dL — ABNORMAL HIGH (ref 70–99)
Glucose-Capillary: 117 mg/dL — ABNORMAL HIGH (ref 70–99)

## 2020-04-20 MED ORDER — DILTIAZEM HCL ER COATED BEADS 180 MG PO CP24
180.0000 mg | ORAL_CAPSULE | Freq: Every day | ORAL | Status: DC
  Administered 2020-04-20: 180 mg via ORAL
  Filled 2020-04-20: qty 1

## 2020-04-20 MED ORDER — APIXABAN 5 MG PO TABS: 5.0000 mg | ORAL_TABLET | Freq: Two times a day (BID) | ORAL | 0 refills | Status: AC

## 2020-04-20 MED ORDER — DILTIAZEM HCL ER COATED BEADS 180 MG PO CP24: 180.0000 mg | ORAL_CAPSULE | Freq: Every day | ORAL | 0 refills | Status: AC

## 2020-04-20 MED ORDER — METOPROLOL SUCCINATE ER 50 MG PO TB24: 50.0000 mg | ORAL_TABLET | Freq: Every day | ORAL | 0 refills | Status: AC

## 2020-04-20 MED ORDER — LOSARTAN POTASSIUM 50 MG PO TABS: 50.0000 mg | ORAL_TABLET | Freq: Every day | ORAL | Status: AC

## 2020-04-20 NOTE — Progress Notes (Signed)
OT Cancellation Note  Patient Details Name: [name] MRN: [id number] DOB: 10/11/1935   Cancelled Treatment:    Reason Eval/Treat Not Completed: OT screened, no needs identified, will sign off. Order received, chart reviewed. Pt at baseline functional independence. No skilled OT needs identified. Will sign off. Please re-consult if additional needs arise.   Richrd Prime, MPH, MS, OTR/L ascom [phone] 04/20/20, 2:22 PM

## 2020-04-20 NOTE — Progress Notes (Signed)
MD Mayford Knife notified that patient does not have a history of breast ca. During reviewing chart RN noted hx of breast ca. I will continue to assess.

## 2020-04-20 NOTE — Progress Notes (Signed)
[facility]    SUBJECTIVE: Dawn Farmer is an 85 year old female with a past medical history significant for type 2 diabetes, hypertension, and hyperlipidemia who presented to the ED on 04/18/20 for chest pain/palpitations. ECG revealed new onset atrial fibrillation with RVR, rate in the 130s. Blood work was significant for sodium of 126, creatinine of 1.02, and high sensitivity troponin negative x 2.  Chest xray revealed mild interstitial edema and a small left pleural effusion. Echocardiogram revealed preserved LV systolic function. She was started on Cardizem infusion as well as Eliquis 5mg  BID.   Today, Dawn Farmer reports significant improvement in shortness of breath.  She denise recurrent palpitations or heart racing sensation.  She denies chest pain or chest pressure.  She denies lower extremity swelling, orthopnea, or PND.  She denies syncopal/presyncopal episodes.    Vitals:   04/19/20 1431 04/19/20 1436 04/19/20 1959 04/20/20 0435  BP:  (!) 129/57 (!) 133/59 120/67  Pulse:  (!) 101 (!) 101 94  Resp:  20 20 20   Temp:  98.5 F (36.9 C) 98.7 F (37.1 C) 98.7 F (37.1 C)  TempSrc:  Oral  Oral  SpO2:  92% 92% 91%  Weight: 65.5 kg   66 kg  Height: 5\' 6"  (1.676 m)        Intake/Output Summary (Last 24 hours) at 04/20/2020 0736 Last data filed at 04/19/2020 2259 Gross per 24 hour  Intake 2146.46 ml  Output --  Net 2146.46 ml      PHYSICAL EXAM  General: Well developed, well nourished, in no acute distress HEENT:  Normocephalic and atramatic Neck:  No JVD.  Lungs: Clear bilaterally to auscultation and percussion. Heart: HRRR . Normal S1 and S2 without gallops or murmurs.  Abdomen: Bowel sounds are positive, abdomen soft and non-tender  Msk:  Back normal, normal gait. Normal strength and tone for age. Extremities: No clubbing, cyanosis or edema.   Neuro: Alert and oriented X 3. Psych:  Good affect, responds appropriately   LABS: Basic Metabolic Panel: Recent Labs     04/19/20 0716 04/20/20 0456  NA 129* 134*  K 4.2 4.1  CL 98 103  CO2 22 24  GLUCOSE 184* 148*  BUN 15 12  CREATININE 1.01* 0.88  CALCIUM 8.1* 8.2*   Liver Function Tests: No results for input(s): AST, ALT, ALKPHOS, BILITOT, PROT, ALBUMIN in the last 72 hours. No results for input(s): LIPASE, AMYLASE in the last 72 hours. CBC: Recent Labs    04/18/20 1120 04/18/20 1120 04/19/20 0716 04/20/20 0456  WBC 11.0*   < > 10.3 7.7  NEUTROABS 8.3*  --   --   --   HGB 11.7*   < > 11.0* 10.8*  HCT 32.7*   < > 30.6* 30.1*  MCV 83.4   < > 83.8 84.6  PLT 157   < > 149* 164   < > = values in this interval not displayed.   Cardiac Enzymes: No results for input(s): CKTOTAL, CKMB, CKMBINDEX, TROPONINI in the last 72 hours. BNP: Invalid input(s): POCBNP D-Dimer: No results for input(s): DDIMER in the last 72 hours. Hemoglobin A1C: Recent Labs    04/18/20 1536  HGBA1C 7.8*   Fasting Lipid Panel: No results for input(s): CHOL, HDL, LDLCALC, TRIG, CHOLHDL, LDLDIRECT in the last 72 hours. Thyroid Function Tests: Recent Labs    04/18/20 1536  TSH 2.147   Anemia Panel: No results for input(s): VITAMINB12, FOLATE, FERRITIN, TIBC, IRON, RETICCTPCT in the last 72 hours.  DG Chest Port 1 View  Result Date: 04/19/2020 CLINICAL DATA:  Dyspnea, wheezing EXAM: PORTABLE CHEST 1 VIEW COMPARISON:  02/16/2013 FINDINGS: Cardiomegaly with mild interstitial edema. Small left pleural effusion. Associated bibasilar opacities, likely atelectasis. Thoracic aortic atherosclerosis. IMPRESSION: Cardiomegaly with mild interstitial edema and small left pleural effusion. Associated bibasilar opacities, likely atelectasis. Electronically Signed   By: Charline Bills M.D.   On: 04/19/2020 10:34   ECHOCARDIOGRAM COMPLETE  Result Date: 04/19/2020    ECHOCARDIOGRAM REPORT   Patient Name:   Dawn Farmer Date of Exam: 04/19/2020 Medical Rec #:  [id number]         Height:       66.0 in Accession #:    [id number]         Weight:       138.0 lb Date of Birth:  [birth year]         BSA:          1.708 m Patient Age:    84 years          BP:           124/65 mmHg Patient Gender: F                 HR:           101 bpm. Exam Location:  [facility] Procedure: 2D Echo, Cardiac Doppler and Color Doppler Indications:     atrial Fibrillation 427.31  History:         Patient has no prior history of Echocardiogram examinations.                  Signs/Symptoms:Murmur; Risk Factors:Hypertension and Diabetes.  Sonographer:     Cristela Blue RDCS (AE) Referring Phys:  [id number] Elwyn Lade AGBATA Diagnosing Phys: Harold Hedge MD IMPRESSIONS  1. Left ventricular ejection fraction, by estimation, is 60 to 65%. The left ventricle has normal function. The left ventricle has no regional wall motion abnormalities. There is mild left ventricular hypertrophy. Left ventricular diastolic parameters were normal.  2. Right ventricular systolic function is normal. The right ventricular size is normal. There is normal pulmonary artery systolic pressure.  3. Left atrial size was mildly dilated.  4. Right atrial size was mildly dilated.  5. The mitral valve was not well visualized. Mild mitral valve regurgitation.  6. The aortic valve is tricuspid. Aortic valve regurgitation is trivial. FINDINGS  Left Ventricle: Left ventricular ejection fraction, by estimation, is 60 to 65%. The left ventricle has normal function. The left ventricle has no regional wall motion abnormalities. The left ventricular internal cavity size was normal in size. There is  mild left ventricular hypertrophy. Left ventricular diastolic parameters were normal. Right Ventricle: The right ventricular size is normal. No increase in right ventricular wall thickness. Right ventricular systolic function is normal. There is normal pulmonary artery systolic pressure. The tricuspid regurgitant velocity is 2.47 m/s, and  with an assumed right atrial pressure of 10 mmHg, the estimated right ventricular systolic  pressure is 34.4 mmHg. Left Atrium: Left atrial size was mildly dilated. Right Atrium: Right atrial size was mildly dilated. Pericardium: There is no evidence of pericardial effusion. Mitral Valve: The mitral valve was not well visualized. Mild mitral valve regurgitation. Tricuspid Valve: The tricuspid valve is grossly normal. Tricuspid valve regurgitation is trivial. Aortic Valve: The aortic valve is tricuspid. Aortic valve regurgitation is trivial. Aortic valve mean gradient measures 5.0 mmHg. Aortic valve peak gradient measures 9.1 mmHg. Aortic valve area, by VTI measures  3.01 cm. Pulmonic Valve: The pulmonic valve was not well visualized. Pulmonic valve regurgitation is trivial. Aorta: The aortic root was not well visualized. IAS/Shunts: The interatrial septum was not well visualized.  LEFT VENTRICLE PLAX 2D LVIDd:         3.52 cm  Diastology LVIDs:         2.11 cm  LV e' lateral:   5.22 cm/s LV PW:         1.24 cm  LV E/e' lateral: 20.1 LV IVS:        1.27 cm  LV e' medial:    4.90 cm/s LVOT diam:     2.00 cm  LV E/e' medial:  21.4 LV SV:         80 LV SV Index:   47 LVOT Area:     3.14 cm  RIGHT VENTRICLE RV Basal diam:  2.46 cm RV S prime:     10.90 cm/s TAPSE (M-mode): 3.4 cm LEFT ATRIUM             Index       RIGHT ATRIUM           Index LA diam:        4.60 cm 2.69 cm/m  RA Area:     20.20 cm LA Vol (A2C):   52.2 ml 30.56 ml/m RA Volume:   55.10 ml  32.26 ml/m LA Vol (A4C):   40.4 ml 23.65 ml/m LA Biplane Vol: 47.7 ml 27.93 ml/m  AORTIC VALVE                   PULMONIC VALVE AV Area (Vmax):    2.52 cm    PV Vmax:        0.83 m/s AV Area (Vmean):   2.95 cm    PV Peak grad:   2.7 mmHg AV Area (VTI):     3.01 cm    RVOT Peak grad: 4 mmHg AV Vmax:           151.00 cm/s AV Vmean:          97.600 cm/s AV VTI:            0.266 m AV Peak Grad:      9.1 mmHg AV Mean Grad:      5.0 mmHg LVOT Vmax:         121.00 cm/s LVOT Vmean:        91.600 cm/s LVOT VTI:          0.255 m LVOT/AV VTI ratio: 0.96  AORTA  Ao Root diam: 2.30 cm MITRAL VALVE                TRICUSPID VALVE MV Area (PHT): 4.89 cm     TR Peak grad:   24.4 mmHg MV Decel Time: 155 msec     TR Vmax:        247.00 cm/s MV E velocity: 105.00 cm/s MV A velocity: 82.90 cm/s   SHUNTS MV E/A ratio:  1.27         Systemic VTI:  0.26 m                             Systemic Diam: 2.00 cm Bartholome Bill MD Electronically signed by Bartholome Bill MD Signature Date/Time: 04/19/2020/10:18:43 AM    Final      Echo: Normal LV systolic function with an EF estimated between 60-65% with mild LVH,  mild MR.   TELEMETRY: Sinus rhythm to sinus tachycardia, rate varying between 95bpm-105bpm   ASSESSMENT AND PLAN:  Principal Problem:   Atrial fibrillation with RVR (HCC) Active Problems:   Hyponatremia   Diabetes mellitus without complication (HCC)   Hypertension   Atrial fibrillation with rapid ventricular response (HCC)   Atrial fibrillation (HCC)   1.  New onset atrial fibrillation with RVR  -Currently fluctuating between sinus rhythm and sinus tachycardia   -Will transition from IV diltiazem to oral diltiazem 180mg  daily   -Continue metoprolol 50mg    -Will remain on Eliquis 5mg  BID with a CHA2D2s VASc score of 5  -Echocardiogram revealing preserved LV function with no significant valvular abnormalities   2.  Hypertension   -Diltiazem infusion d/c; started on oral 180mg  daily   -Continue metoprolol 50mg  daily, losartan 50mg  daily    3.  Hyponatremia   -Etiology unclear; improved on IV fluids   -Currently off of HCTZ   No further workup indicated from a cardiac standpoint.  Will see in the office within 10 days of discharge. Call with any questions or concerns.   The history, physical exam findings, and plan of care were all discussed with Dr. , and all decision making was made in collaboration.    PA-C 04/20/2020 7:36 AM

## 2020-04-20 NOTE — Discharge Summary (Signed)
Physician Discharge Summary  Dawn Farmer MWU:[id number] DOB: 04/04/1935 DOA: 04/18/2020  PCP: Rayetta HumphreyGeorge, Dawn A, MD  Admit date: 04/18/2020 Discharge date: 04/20/2020  Admitted From: home Disposition:  home  Recommendations for Outpatient Follow-up:  1. Follow up with PCP in 1-2 weeks 2. F/u cardio, Dr. Lady GaryFath, in 10 days   Home Health: no Equipment/Devices:  Discharge Condition: stable CODE STATUS: full  Diet recommendation: Heart Healthy / Carb Modified   Brief/Interim Summary: HPI was taken from Dr. [name]: Dawn MajesticPhyllis A Farmer is a 85 y.o. female with medical history significant for hypertension, diabetes mellitus, GERD and anxiety who was sent to the emergency room from the urgent care for evaluation of new onset atrial fibrillation with rapid ventricular rate and chest pain.  Patient had gone to the urgent care because of chest pain that woke her up out of her sleep at 2 AM this morning.  Pain is mostly midsternal, nonradiating and has been constant since it started.  She rates it a 7 x 10 in intensity at its worst and it is associated with SOB. She denies having any associated nausea, vomiting, diaphoresis, dizziness or lightheadedness.  She denies having any fever, no chills, no cough, no urinary symptoms or changes in her bowel habits. She has no known relieving or aggravating factors Labs revealed a sodium of 126, potassium of 3.6.  Initial troponin was 10  ED Course: Patient was seen in the emergency room for evaluation of new onset atrial fibrillation and chest pain.  She received 5 mg of IV metoprolol with transient improvement in heart rate.  Hospital Course from Dr. Wilfred LacyJ. Sheenah Dimitroff 6/16-6/17/21: Pt presented w/ new onset a. Fib and was started on IV diltiazem. Cardio was consulted and weaned the pt off of IV diltiazem and onto po diltiazem. Pt was also started on metoprolol as well as eliquis. The a. fib was controlled at the time of d/c. Pt will f/u w/ cardio, Dr. Lady GaryFath, in 10  days, pt verbalized her understanding. PT saw the pt and did not recommend any therapy.   Discharge Diagnoses:  Principal Problem:   Atrial fibrillation with RVR (HCC) Active Problems:   Hyponatremia   Diabetes mellitus without complication (HCC)   Hypertension   Atrial fibrillation with rapid ventricular response (HCC)   Atrial fibrillation (HCC)  Atrial fibrillation: w/ RVR. New onset. D/c IV cardizem drip. Continue on po diltiazem & continue on metoprolol. Echo shows EF 60-65%, normal diastolic function. CHADS2VASC score of 5 and ideally requires long-term anticoagulation as primary prophylaxis for an acute stroke. Cardio following and recs apprec  Hyponatremia: unclear etiology. Close to being WNL. D/c IVFs  Hypertension: continue on metoprolol, losartan   DM2: continue on SSI w/ accuchecks   Thrombocytopenia: etiology unclear. Mild. Will continue to monitor    Discharge Instructions  Discharge Instructions    Diet - low sodium heart healthy   Complete by: As directed    Diet Carb Modified   Complete by: As directed    Discharge instructions   Complete by: As directed    F/u PCP in 1-2 weeks; F/u cardio, Dr. Lady GaryFath, in 10 days   Increase activity slowly   Complete by: As directed      Allergies as of 04/20/2020      Reactions   Metformin And Related    Reglan [metoclopramide]       Medication List    STOP taking these medications   amLODipine 5 MG tablet Commonly known as: NORVASC  losartan-hydrochlorothiazide 100-12.5 MG tablet Commonly known as: HYZAAR     TAKE these medications   apixaban 5 MG Tabs tablet Commonly known as: ELIQUIS Take 1 tablet (5 mg total) by mouth 2 (two) times daily.   aspirin 81 MG tablet Take 81 mg by mouth daily.   CENTRUM SILVER ULTRA WOMENS PO Take 1 capsule by mouth daily at 12 noon.   diltiazem 180 MG 24 hr capsule Commonly known as: CARDIZEM CD Take 1 capsule (180 mg total) by mouth daily. Start taking on: April 21, 2020   gabapentin 600 MG tablet Commonly known as: NEURONTIN Take 600 mg by mouth 2 (two) times daily.   glipiZIDE 5 MG 24 hr tablet Commonly known as: GLUCOTROL XL Take 5 mg by mouth 2 (two) times daily.   losartan 50 MG tablet Commonly known as: COZAAR Take 1 tablet (50 mg total) by mouth daily. What changed: when to take this   lovastatin 20 MG tablet Commonly known as: MEVACOR Take 20 mg by mouth daily at 12 noon.   metoprolol succinate 50 MG 24 hr tablet Commonly known as: TOPROL-XL Take 1 tablet (50 mg total) by mouth daily. Take with or immediately following a meal. Start taking on: April 21, 2020   OMEGA-3 KRILL OIL PO Take 1 capsule by mouth daily at 12 noon.   omeprazole 40 MG capsule Commonly known as: PRILOSEC Take 40 mg by mouth 2 (two) times daily.   predniSONE 20 MG tablet Commonly known as: DELTASONE Take 20 mg by mouth 2 (two) times daily.   traMADol 50 MG tablet Commonly known as: ULTRAM Take 1 tablet (50 mg total) by mouth every 8 (eight) hours as needed for moderate pain or severe pain.       Follow-up Information    Dalia Heading, MD Follow up in 10 day(s).   Specialty: Cardiology Why: Follow up with Dr. Harold Hedge or Marga Hoots PA-C within 10 days of discharge  Contact information: [address] [place] [address] [phone]              Allergies  Allergen Reactions  . Metformin And Related   . Reglan [Metoclopramide]     Consultations:  Cardio, Dr. Lady Gary    Procedures/Studies: DG Lumbar Spine Complete  Result Date: 04/01/2020 CLINICAL DATA:  Right hip pain EXAM: LUMBAR SPINE - COMPLETE 4+ VIEW COMPARISON:  None. FINDINGS: Five lumbar type vertebral bodies are well visualized. Vertebral body height is well maintained. Multilevel disc space narrowing and osteophytic changes are seen. No anterolisthesis is noted. Facet hypertrophic changes are seen. No pars defects are noted. No soft tissue abnormality is  noted. IMPRESSION: Multilevel degenerative change without acute abnormality. Electronically Signed   By: Alcide Clever M.D.   On: 04/01/2020 09:07   DG Chest Port 1 View  Result Date: 04/19/2020 CLINICAL DATA:  Dyspnea, wheezing EXAM: PORTABLE CHEST 1 VIEW COMPARISON:  02/16/2013 FINDINGS: Cardiomegaly with mild interstitial edema. Small left pleural effusion. Associated bibasilar opacities, likely atelectasis. Thoracic aortic atherosclerosis. IMPRESSION: Cardiomegaly with mild interstitial edema and small left pleural effusion. Associated bibasilar opacities, likely atelectasis. Electronically Signed   By: Charline Bills M.D.   On: 04/19/2020 10:34   ECHOCARDIOGRAM COMPLETE  Result Date: 04/19/2020    ECHOCARDIOGRAM REPORT   Patient Name:   Dawn Majestic Date of Exam: 04/19/2020 Medical Rec #:  [id number]         Height:       66.0 in Accession #:  [id number]        Weight:       138.0 lb Date of Birth:  Oct 31, 1935         BSA:          1.708 m Patient Age:    84 years          BP:           124/65 mmHg Patient Gender: F                 HR:           101 bpm. Exam Location:  [facility] Procedure: 2D Echo, Cardiac Doppler and Color Doppler Indications:     atrial Fibrillation 427.31  History:         Patient has no prior history of Echocardiogram examinations.                  Signs/Symptoms:Murmur; Risk Factors:Hypertension and Diabetes.  Sonographer:     Cristela Blue RDCS (AE) Referring Phys:  [id number] Elwyn Lade AGBATA Diagnosing Phys: Harold Hedge MD IMPRESSIONS  1. Left ventricular ejection fraction, by estimation, is 60 to 65%. The left ventricle has normal function. The left ventricle has no regional wall motion abnormalities. There is mild left ventricular hypertrophy. Left ventricular diastolic parameters were normal.  2. Right ventricular systolic function is normal. The right ventricular size is normal. There is normal pulmonary artery systolic pressure.  3. Left atrial size was mildly dilated.  4.  Right atrial size was mildly dilated.  5. The mitral valve was not well visualized. Mild mitral valve regurgitation.  6. The aortic valve is tricuspid. Aortic valve regurgitation is trivial. FINDINGS  Left Ventricle: Left ventricular ejection fraction, by estimation, is 60 to 65%. The left ventricle has normal function. The left ventricle has no regional wall motion abnormalities. The left ventricular internal cavity size was normal in size. There is  mild left ventricular hypertrophy. Left ventricular diastolic parameters were normal. Right Ventricle: The right ventricular size is normal. No increase in right ventricular wall thickness. Right ventricular systolic function is normal. There is normal pulmonary artery systolic pressure. The tricuspid regurgitant velocity is 2.47 m/s, and  with an assumed right atrial pressure of 10 mmHg, the estimated right ventricular systolic pressure is 34.4 mmHg. Left Atrium: Left atrial size was mildly dilated. Right Atrium: Right atrial size was mildly dilated. Pericardium: There is no evidence of pericardial effusion. Mitral Valve: The mitral valve was not well visualized. Mild mitral valve regurgitation. Tricuspid Valve: The tricuspid valve is grossly normal. Tricuspid valve regurgitation is trivial. Aortic Valve: The aortic valve is tricuspid. Aortic valve regurgitation is trivial. Aortic valve mean gradient measures 5.0 mmHg. Aortic valve peak gradient measures 9.1 mmHg. Aortic valve area, by VTI measures 3.01 cm. Pulmonic Valve: The pulmonic valve was not well visualized. Pulmonic valve regurgitation is trivial. Aorta: The aortic root was not well visualized. IAS/Shunts: The interatrial septum was not well visualized.  LEFT VENTRICLE PLAX 2D LVIDd:         3.52 cm  Diastology LVIDs:         2.11 cm  LV e' lateral:   5.22 cm/s LV PW:         1.24 cm  LV E/e' lateral: 20.1 LV IVS:        1.27 cm  LV e' medial:    4.90 cm/s LVOT diam:     2.00 cm  LV E/e' medial:  21.4 LV SV:  80 LV SV Index:   47 LVOT Area:     3.14 cm  RIGHT VENTRICLE RV Basal diam:  2.46 cm RV S prime:     10.90 cm/s TAPSE (M-mode): 3.4 cm LEFT ATRIUM             Index       RIGHT ATRIUM           Index LA diam:        4.60 cm 2.69 cm/m  RA Area:     20.20 cm LA Vol (A2C):   52.2 ml 30.56 ml/m RA Volume:   55.10 ml  32.26 ml/m LA Vol (A4C):   40.4 ml 23.65 ml/m LA Biplane Vol: 47.7 ml 27.93 ml/m  AORTIC VALVE                   PULMONIC VALVE AV Area (Vmax):    2.52 cm    PV Vmax:        0.83 m/s AV Area (Vmean):   2.95 cm    PV Peak grad:   2.7 mmHg AV Area (VTI):     3.01 cm    RVOT Peak grad: 4 mmHg AV Vmax:           151.00 cm/s AV Vmean:          97.600 cm/s AV VTI:            0.266 m AV Peak Grad:      9.1 mmHg AV Mean Grad:      5.0 mmHg LVOT Vmax:         121.00 cm/s LVOT Vmean:        91.600 cm/s LVOT VTI:          0.255 m LVOT/AV VTI ratio: 0.96  AORTA Ao Root diam: 2.30 cm MITRAL VALVE                TRICUSPID VALVE MV Area (PHT): 4.89 cm     TR Peak grad:   24.4 mmHg MV Decel Time: 155 msec     TR Vmax:        247.00 cm/s MV E velocity: 105.00 cm/s MV A velocity: 82.90 cm/s   SHUNTS MV E/A ratio:  1.27         Systemic VTI:  0.26 m                             Systemic Diam: 2.00 cm Bartholome Bill MD Electronically signed by Bartholome Bill MD Signature Date/Time: 04/19/2020/10:18:43 AM    Final    DG Hip Unilat W or Wo Pelvis 2-3 Views Right  Result Date: 04/01/2020 CLINICAL DATA:  Right hip pain EXAM: DG HIP (WITH OR WITHOUT PELVIS) 2-3V RIGHT COMPARISON:  None. FINDINGS: Alignment is anatomic. There is no acute fracture. Joint space is preserved. Sacroiliac joints are unremarkable. IMPRESSION: Negative. Electronically Signed   By: Macy Mis M.D.   On: 04/01/2020 09:07       Subjective: pt denies any complaints   Discharge Exam: Vitals:   04/20/20 0750 04/20/20 1244  BP: 133/63 128/77  Pulse: (!) 101 96  Resp: 17 19  Temp: 99.3 F (37.4 C) 98.3 F (36.8 C)  SpO2: 96%  91%   Vitals:   04/19/20 1959 04/20/20 0435 04/20/20 0750 04/20/20 1244  BP: (!) 133/59 120/67 133/63 128/77  Pulse: (!) 101 94 (!) 101 96  Resp: 20 20  17 19  Temp: 98.7 F (37.1 C) 98.7 F (37.1 C) 99.3 F (37.4 C) 98.3 F (36.8 C)  TempSrc:  Oral Oral Oral  SpO2: 92% 91% 96% 91%  Weight:  66 kg    Height:        General: Pt is alert, awake, not in acute distress Cardiovascular:  S1/S2 +, no rubs, no gallops Respiratory: CTA bilaterally, no wheezing, no rhonchi Abdominal: Soft, NT, ND, bowel sounds + Extremities: no edema, no cyanosis    The results of significant diagnostics from this hospitalization (including imaging, microbiology, ancillary and laboratory) are listed below for reference.     Microbiology: Recent Results (from the past 240 hour(s))  SARS Coronavirus 2 by RT PCR (hospital order, performed in [facility] hospital lab) Nasopharyngeal Nasopharyngeal Swab     Status: None   Collection Time: 04/18/20  1:00 PM   Specimen: Nasopharyngeal Swab  Result Value Ref Range Status   SARS Coronavirus 2 NEGATIVE NEGATIVE Final    Comment: (NOTE) SARS-CoV-2 target nucleic acids are NOT DETECTED.  The SARS-CoV-2 RNA is generally detectable in upper and lower respiratory specimens during the acute phase of infection. The lowest concentration of SARS-CoV-2 viral copies this assay can detect is 250 copies / mL. A negative result does not preclude SARS-CoV-2 infection and should not be used as the sole basis for treatment or other patient management decisions.  A negative result may occur with improper specimen collection / handling, submission of specimen other than nasopharyngeal swab, presence of viral mutation(s) within the areas targeted by this assay, and inadequate number of viral copies (<250 copies / mL). A negative result must be combined with clinical observations, patient history, and epidemiological information.  Fact Sheet for Patients:    BoilerBrush.com.cy  Fact Sheet for Healthcare Providers: https://pope.com/  This test is not yet approved or  cleared by the [place] FDA and has been authorized for detection and/or diagnosis of SARS-CoV-2 by FDA under an Emergency Use Authorization (EUA).  This EUA will remain in effect (meaning this test can be used) for the duration of the COVID-19 declaration under Section 564(b)(1) of the Act, 21 U.S.C. section 360bbb-3(b)(1), unless the authorization is terminated or revoked sooner.  Performed at [facility], [address] [place], [place] [address]      Labs: BNP (last 3 results) No results for input(s): BNP in the last 8760 hours. Basic Metabolic Panel: Recent Labs  Lab 04/18/20 1120 04/19/20 0716 04/20/20 0456  NA 126* 129* 134*  K 3.6 4.2 4.1  CL 94* 98 103  CO2 23 22 24   GLUCOSE 176* 184* 148*  BUN 15 15 12   CREATININE 1.02* 1.01* 0.88  CALCIUM 8.3* 8.1* 8.2*   Liver Function Tests: No results for input(s): AST, ALT, ALKPHOS, BILITOT, PROT, ALBUMIN in the last 168 hours. No results for input(s): LIPASE, AMYLASE in the last 168 hours. No results for input(s): AMMONIA in the last 168 hours. CBC: Recent Labs  Lab 04/18/20 1120 04/19/20 0716 04/20/20 0456  WBC 11.0* 10.3 7.7  NEUTROABS 8.3*  --   --   HGB 11.7* 11.0* 10.8*  HCT 32.7* 30.6* 30.1*  MCV 83.4 83.8 84.6  PLT 157 149* 164   Cardiac Enzymes: No results for input(s): CKTOTAL, CKMB, CKMBINDEX, TROPONINI in the last 168 hours. BNP: Invalid input(s): POCBNP CBG: Recent Labs  Lab 04/19/20 1156 04/19/20 1645 04/19/20 2159 04/20/20 0752 04/20/20 1245  GLUCAP 121* 158* 211* 139* 117*   D-Dimer No  results for input(s): DDIMER in the last 72 hours. Hgb A1c Recent Labs    04/18/20 1536  HGBA1C 7.8*   Lipid Profile No results for input(s): CHOL, HDL, LDLCALC, TRIG, CHOLHDL, LDLDIRECT in the last 72 hours. Thyroid  function studies Recent Labs    04/18/20 1536  TSH 2.147   Anemia work up No results for input(s): VITAMINB12, FOLATE, FERRITIN, TIBC, IRON, RETICCTPCT in the last 72 hours. Urinalysis    Component Value Date/Time   COLORURINE YELLOW 08/22/2014 1128   APPEARANCEUR HAZY 08/22/2014 1128   LABSPEC 1.010 08/22/2014 1128   PHURINE 6.5 08/22/2014 1128   GLUCOSEU NEGATIVE 08/22/2014 1128   HGBUR SMALL 08/22/2014 1128   BILIRUBINUR NEGATIVE 08/22/2014 1128   KETONESUR NEGATIVE 08/22/2014 1128   PROTEINUR NEGATIVE 08/22/2014 1128   NITRITE NEGATIVE 08/22/2014 1128   LEUKOCYTESUR LARGE 08/22/2014 1128   Sepsis Labs Invalid input(s): PROCALCITONIN,  WBC,  LACTICIDVEN Microbiology Recent Results (from the past 240 hour(s))  SARS Coronavirus 2 by RT PCR (hospital order, performed in [facility] hospital lab) Nasopharyngeal Nasopharyngeal Swab     Status: None   Collection Time: 04/18/20  1:00 PM   Specimen: Nasopharyngeal Swab  Result Value Ref Range Status   SARS Coronavirus 2 NEGATIVE NEGATIVE Final    Comment: (NOTE) SARS-CoV-2 target nucleic acids are NOT DETECTED.  The SARS-CoV-2 RNA is generally detectable in upper and lower respiratory specimens during the acute phase of infection. The lowest concentration of SARS-CoV-2 viral copies this assay can detect is 250 copies / mL. A negative result does not preclude SARS-CoV-2 infection and should not be used as the sole basis for treatment or other patient management decisions.  A negative result may occur with improper specimen collection / handling, submission of specimen other than nasopharyngeal swab, presence of viral mutation(s) within the areas targeted by this assay, and inadequate number of viral copies (<250 copies / mL). A negative result must be combined with clinical observations, patient history, and epidemiological information.  Fact Sheet for Patients:   BoilerBrush.com.cy  Fact Sheet for  Healthcare Providers: https://pope.com/  This test is not yet approved or  cleared by the [place] FDA and has been authorized for detection and/or diagnosis of SARS-CoV-2 by FDA under an Emergency Use Authorization (EUA).  This EUA will remain in effect (meaning this test can be used) for the duration of the COVID-19 declaration under Section 564(b)(1) of the Act, 21 U.S.C. section 360bbb-3(b)(1), unless the authorization is terminated or revoked sooner.  Performed at [facility], [address], [place], [place] [address]      Time coordinating discharge: Over 30 minutes  SIGNED:   Charise Killian, MD  [facility] 04/20/2020, 1:59 PM Pager   If 7PM-7AM, please contact night-coverage [email or web address]

## 2020-04-20 NOTE — Evaluation (Signed)
Physical Therapy Evaluation Patient Details Name: Dawn Farmer MRN: [id number] DOB: [birth year] Today's Date: 04/20/2020   History of Present Illness  Pt is an 85 y/o female admitted for A-fib with RVR. Her chief complaint upon admission was chest pain. PMH includes HTN, DM, GERD, anxiety, heart murmur, and peripheral neuropathy.  Clinical Impression  Dawn Farmer was received sitting up in bed after having ambulated to and from the bathroom. She was agreeable to PT eval. Pt stated that she is not on oxygen at home and reports no falls. Pt with generalized strength of 4/5 in BLE. Pt ambulated 2 laps around the nurses station with CGA for safety on room air. Pt ambulated with steady gait speed throughout entire distance and exhibited no LOB. Pt's highest HR noted to be 106 bpm with activity. Pt reports that her balance and endurance are her baseline. No further PT required at this time secondary to pt being at her baseline for PLOF.   SaO2 on room air at rest = 90% SaO2 on room air while ambulating = 94-97% SaO2 on 0 liters of O2 while ambulating = 94-97%; no need for O2 while ambulating           Follow Up Recommendations No PT follow up    Equipment Recommendations  None recommended by PT    Recommendations for Other Services       Precautions / Restrictions Precautions Precautions: None      Mobility  Bed Mobility               General bed mobility comments: not assessed as pt sitting up in bed upon arrival  Transfers Overall transfer level: Needs assistance Equipment used: None Transfers: Sit to/from Stand Sit to Stand: Min guard         General transfer comment: CGA for safety however pt with no noted difficulty with transfers; safe technique  Ambulation/Gait Ambulation/Gait assistance: Min guard Gait Distance (Feet): 450 Feet Assistive device: None Gait Pattern/deviations: Step-through pattern Gait velocity: normal   General Gait Details: Pt  able to maintain steady gait speed throughout distance, no LOB or gross deviations noted.  Stairs            Wheelchair Mobility    Modified Rankin (Stroke Patients Only)       Balance Overall balance assessment: Needs assistance Sitting-balance support: Feet unsupported Sitting balance-Leahy Scale: Normal Sitting balance - Comments: Pt sitting in bed and in recliner chair with no truncal sway with feet supported or unsupported.   Standing balance support: No upper extremity supported Standing balance-Leahy Scale: Good Standing balance comment: CGA for safety however no LOB demonstrated                             Pertinent Vitals/Pain Pain Assessment: Faces Faces Pain Scale: Hurts a little bit Pain Location: chest Pain Descriptors / Indicators: Discomfort Pain Intervention(s): Monitored during session    Home Living Family/patient expects to be discharged to:: Private residence Living Arrangements: Alone   Type of Home: House Home Access: Stairs to enter Entrance Stairs-Rails: Can reach both Entrance Stairs-Number of Steps: 4 Home Layout: One level Home Equipment: Cane - single point Additional Comments: only uses cane when hiking with her church group    Prior Function Level of Independence: Independent         Comments: Pt reports independence with all ambulation., ADLs, chores, and out of home activities (shopping, etc). Pt also  still drives.     Hand Dominance        Extremity/Trunk Assessment   Upper Extremity Assessment Upper Extremity Assessment: Overall WFL for tasks assessed    Lower Extremity Assessment Lower Extremity Assessment: Generalized weakness (generally 4/5)       Communication   Communication: No difficulties  Cognition Arousal/Alertness: Awake/alert Behavior During Therapy: WFL for tasks assessed/performed Overall Cognitive Status: Within Functional Limits for tasks assessed                                         General Comments      Exercises     Assessment/Plan    PT Assessment Patent does not need any further PT services  PT Problem List         PT Treatment Interventions      PT Goals (Current goals can be found in the Care Plan section)  Acute Rehab PT Goals Patient Stated Goal: to go home PT Goal Formulation: All assessment and education complete, DC therapy Time For Goal Achievement: 04/20/20 Potential to Achieve Goals: Good    Frequency     Barriers to discharge        Co-evaluation               AM-PAC PT "6 Clicks" Mobility  Outcome Measure Help needed turning from your back to your side while in a flat bed without using bedrails?: None Help needed moving from lying on your back to sitting on the side of a flat bed without using bedrails?: None Help needed moving to and from a bed to a chair (including a wheelchair)?: None Help needed standing up from a chair using your arms (e.g., wheelchair or bedside chair)?: None Help needed to walk in hospital room?: None Help needed climbing 3-5 steps with a railing? : None 6 Click Score: 24    End of Session Equipment Utilized During Treatment: Gait belt Activity Tolerance: Patient tolerated treatment well Patient left: in chair;with chair alarm set;with call bell/phone within reach Nurse Communication: Mobility status PT Visit Diagnosis: Muscle weakness (generalized) (M62.81)    Time: 0076-2263 PT Time Calculation (min) (ACUTE ONLY): 30 min   Charges:   PT Evaluation $PT Eval Low Complexity: 1 Low PT Treatments $Gait Training: 8-22 mins        Vale Haven, SPT  Dawn Farmer 04/20/2020, 4:35 PM

## 2020-04-28 ENCOUNTER — Other Ambulatory Visit: Payer: Self-pay | Admitting: Family Medicine

## 2020-04-28 DIAGNOSIS — M5416 Radiculopathy, lumbar region: Secondary | ICD-10-CM

## 2020-05-13 ENCOUNTER — Ambulatory Visit
Admission: RE | Admit: 2020-05-13 | Discharge: 2020-05-13 | Disposition: A | Discharge status: Home / Self Care | Payer: Medicare Other | Source: Ambulatory Visit | Attending: Family Medicine | Admitting: Family Medicine

## 2020-05-13 ENCOUNTER — Other Ambulatory Visit: Payer: Self-pay

## 2020-05-13 DIAGNOSIS — M5416 Radiculopathy, lumbar region: Secondary | ICD-10-CM | POA: Insufficient documentation

## 2020-05-25 ENCOUNTER — Other Ambulatory Visit: Payer: Self-pay | Admitting: Family Medicine

## 2020-10-06 ENCOUNTER — Other Ambulatory Visit: Payer: Self-pay

## 2020-10-06 ENCOUNTER — Emergency Department
Admission: EM | Admit: 2020-10-06 | Discharge: 2020-10-06 | Disposition: A | Discharge status: Home / Self Care | Payer: Medicare Other | Attending: Emergency Medicine | Admitting: Emergency Medicine

## 2020-10-06 ENCOUNTER — Emergency Department: Payer: Medicare Other

## 2020-10-06 ENCOUNTER — Ambulatory Visit
Admission: EM | Admit: 2020-10-06 | Discharge: 2020-10-06 | Disposition: A | Discharge status: Home / Self Care | Payer: Medicare Other | Attending: Family Medicine | Admitting: Family Medicine

## 2020-10-06 DIAGNOSIS — W0110XA Fall on same level from slipping, tripping and stumbling with subsequent striking against unspecified object, initial encounter: Secondary | ICD-10-CM | POA: Diagnosis not present

## 2020-10-06 DIAGNOSIS — Z79899 Other long term (current) drug therapy: Secondary | ICD-10-CM | POA: Diagnosis not present

## 2020-10-06 DIAGNOSIS — E1142 Type 2 diabetes mellitus with diabetic polyneuropathy: Secondary | ICD-10-CM | POA: Insufficient documentation

## 2020-10-06 DIAGNOSIS — S0993XA Unspecified injury of face, initial encounter: Secondary | ICD-10-CM | POA: Diagnosis present

## 2020-10-06 DIAGNOSIS — S0990XA Unspecified injury of head, initial encounter: Secondary | ICD-10-CM

## 2020-10-06 DIAGNOSIS — S022XXB Fracture of nasal bones, initial encounter for open fracture: Secondary | ICD-10-CM | POA: Insufficient documentation

## 2020-10-06 DIAGNOSIS — Y92488 Other paved roadways as the place of occurrence of the external cause: Secondary | ICD-10-CM | POA: Insufficient documentation

## 2020-10-06 DIAGNOSIS — Z23 Encounter for immunization: Secondary | ICD-10-CM | POA: Insufficient documentation

## 2020-10-06 DIAGNOSIS — Z7984 Long term (current) use of oral hypoglycemic drugs: Secondary | ICD-10-CM | POA: Insufficient documentation

## 2020-10-06 DIAGNOSIS — Z7982 Long term (current) use of aspirin: Secondary | ICD-10-CM | POA: Insufficient documentation

## 2020-10-06 DIAGNOSIS — I10 Essential (primary) hypertension: Secondary | ICD-10-CM | POA: Diagnosis not present

## 2020-10-06 DIAGNOSIS — S0081XA Abrasion of other part of head, initial encounter: Secondary | ICD-10-CM

## 2020-10-06 DIAGNOSIS — Z7901 Long term (current) use of anticoagulants: Secondary | ICD-10-CM | POA: Insufficient documentation

## 2020-10-06 DIAGNOSIS — W19XXXA Unspecified fall, initial encounter: Secondary | ICD-10-CM

## 2020-10-06 LAB — CBC WITH DIFFERENTIAL/PLATELET
Abs Immature Granulocytes: 0.03 10*3/uL (ref 0.00–0.07)
Basophils Absolute: 0 10*3/uL (ref 0.0–0.1)
Basophils Relative: 1 %
Eosinophils Absolute: 0.1 10*3/uL (ref 0.0–0.5)
Eosinophils Relative: 2 %
HCT: 38.9 % (ref 36.0–46.0)
Hemoglobin: 12.7 g/dL (ref 12.0–15.0)
Immature Granulocytes: 0 %
Lymphocytes Relative: 23 %
Lymphs Abs: 1.7 10*3/uL (ref 0.7–4.0)
MCH: 28.3 pg (ref 26.0–34.0)
MCHC: 32.6 g/dL (ref 30.0–36.0)
MCV: 86.6 fL (ref 80.0–100.0)
Monocytes Absolute: 0.8 10*3/uL (ref 0.1–1.0)
Monocytes Relative: 11 %
Neutro Abs: 4.8 10*3/uL (ref 1.7–7.7)
Neutrophils Relative %: 63 %
Platelets: 217 10*3/uL (ref 150–400)
RBC: 4.49 MIL/uL (ref 3.87–5.11)
RDW: 13.2 % (ref 11.5–15.5)
WBC: 7.5 10*3/uL (ref 4.0–10.5)
nRBC: 0 % (ref 0.0–0.2)

## 2020-10-06 LAB — PROTIME-INR
INR: 1.1 (ref 0.8–1.2)
Prothrombin Time: 13.6 seconds (ref 11.4–15.2)

## 2020-10-06 LAB — BASIC METABOLIC PANEL
Anion gap: 13 (ref 5–15)
BUN: 22 mg/dL (ref 8–23)
CO2: 25 mmol/L (ref 22–32)
Calcium: 9.4 mg/dL (ref 8.9–10.3)
Chloride: 101 mmol/L (ref 98–111)
Creatinine, Ser: 0.96 mg/dL (ref 0.44–1.00)
GFR, Estimated: 59 mL/min — ABNORMAL LOW (ref >60)
Glucose, Bld: 145 mg/dL — ABNORMAL HIGH (ref 70–99)
Potassium: 4.5 mmol/L (ref 3.5–5.1)
Sodium: 139 mmol/L (ref 135–145)

## 2020-10-06 MED ORDER — HYDROCODONE-ACETAMINOPHEN 5-325 MG PO TABS
1.0000 | ORAL_TABLET | ORAL | 0 refills | Status: DC | PRN
Start: 2020-10-06 — End: 2022-01-29

## 2020-10-06 MED ORDER — TETANUS-DIPHTH-ACELL PERTUSSIS 5-2.5-18.5 LF-MCG/0.5 IM SUSY
0.5000 mL | PREFILLED_SYRINGE | Freq: Once | INTRAMUSCULAR | Status: AC
  Administered 2020-10-06: 0.5 mL via INTRAMUSCULAR
  Filled 2020-10-06: qty 0.5

## 2020-10-06 MED ORDER — HYDROCODONE-ACETAMINOPHEN 5-325 MG PO TABS
1.0000 | ORAL_TABLET | Freq: Once | ORAL | Status: AC
  Administered 2020-10-06: 1 via ORAL
  Filled 2020-10-06: qty 1

## 2020-10-06 MED ORDER — CEPHALEXIN 500 MG PO CAPS: 1000.0000 mg | ORAL_CAPSULE | Freq: Two times a day (BID) | ORAL | 0 refills | Status: DC

## 2020-10-06 NOTE — ED Provider Notes (Signed)
[facility] Emergency Department Provider Note  ____________________________________________  Time seen: Approximately 10:30 PM  I have reviewed the triage vital signs and the nursing notes.   HISTORY  Chief Complaint Fall and Head Injury    HPI Dawn Farmer is a 85 y.o. female who presents the emergency department complaining of facial injury after a mechanical fall.  Patient states that she was going to the parade with her son when she caught her toe on a raised concrete lip causing her to fall striking her face on the ground.  No loss of consciousness.  She was complaining of facial pain and headache.  Referred to the emergency department from urgent care given the nature of the injury and no imaging currently at urgent care.   Patient has had no subsequent loss of consciousness.  She denied any complaint other than facial abrasions and facial pain and headache.  No radicular symptoms in the upper or lower extremity.  No chest pain, shortness of breath, low back pain, hip pain.  No medications for this complaint prior to arrival.  Patient needs a tetanus shot tonight.        Past Medical History:  Diagnosis Date  . Anxiety   . Arthritis   . Diabetes mellitus without complication (HCC)   . GERD (gastroesophageal reflux disease)   . Heart murmur   . Hypertension   . Peripheral neuropathy     Patient Active Problem List   Diagnosis Date Noted  . Atrial fibrillation (HCC) 04/19/2020  . Hyponatremia 04/18/2020  . Atrial fibrillation with RVR (HCC) 04/18/2020  . Atrial fibrillation with rapid ventricular response (HCC) 04/18/2020  . Diabetes mellitus without complication (HCC)   . Hypertension     Past Surgical History:  Procedure Laterality Date  . ABDOMINAL HYSTERECTOMY    . BALLOON DILATION N/A 04/23/2019   Procedure: BALLOON DILATION;  Surgeon: Scot Jun, MD;  Location: [facility] ENDOSCOPY;  Service: Endoscopy;  Laterality: N/A;  . CARPAL  TUNNEL RELEASE    . CHOLECYSTECTOMY    . COLONOSCOPY WITH ESOPHAGOGASTRODUODENOSCOPY (EGD)    . ESOPHAGOGASTRODUODENOSCOPY (EGD) WITH PROPOFOL N/A 04/23/2019   Procedure: ESOPHAGOGASTRODUODENOSCOPY (EGD) WITH PROPOFOL;  Surgeon: Scot Jun, MD;  Location: [facility] ENDOSCOPY;  Service: Endoscopy;  Laterality: N/A;  . rotator cuff tear Right 2016  . SHOULDER ARTHROSCOPY    . SHOULDER ARTHROSCOPY Right 04/06/2015   Procedure: right shoulder arthroscopy, arthroscopic labral debridement, subacromial decompression, mini open rotator cuff repair;  Surgeon: Christena Flake, MD;  Location: [facility] ORS;  Service: Orthopedics;  Laterality: Right;    Prior to Admission medications   Medication Sig Start Date End Date Taking? Authorizing Provider  apixaban (ELIQUIS) 5 MG TABS tablet Take 1 tablet (5 mg total) by mouth 2 (two) times daily. 04/20/20 05/20/20  Charise Killian, MD  aspirin 81 MG tablet Take 81 mg by mouth daily.    [provider]  cephALEXin (KEFLEX) 500 MG capsule Take 2 capsules (1,000 mg total) by mouth 2 (two) times daily. 10/06/20   Adaisha Campise, Delorise Royals, PA-C  diltiazem (CARDIZEM CD) 180 MG 24 hr capsule Take 1 capsule (180 mg total) by mouth daily. 04/21/20 05/21/20  Charise Killian, MD  gabapentin (NEURONTIN) 600 MG tablet Take 600 mg by mouth 2 (two) times daily.    [provider]  glipiZIDE (GLUCOTROL XL) 5 MG 24 hr tablet Take 5 mg by mouth 2 (two) times daily. 01/20/20   [provider]  HYDROcodone-acetaminophen (  NORCO/VICODIN) 5-325 MG tablet Take 1 tablet by mouth every 4 (four) hours as needed for moderate pain. 10/06/20   Keilan Nichol, Delorise Royals, PA-C  losartan (COZAAR) 50 MG tablet Take 1 tablet (50 mg total) by mouth daily. 04/20/20 05/20/20  Charise Killian, MD  lovastatin (MEVACOR) 20 MG tablet Take 20 mg by mouth daily at 12 noon.    [provider]  metoprolol succinate (TOPROL-XL) 50 MG 24 hr tablet Take 1 tablet (50 mg total) by mouth  daily. Take with or immediately following a meal. 04/21/20 05/21/20  Charise Killian, MD  Multiple Vitamins-Minerals (CENTRUM SILVER ULTRA WOMENS PO) Take 1 capsule by mouth daily at 12 noon.    [provider]  OMEGA-3 KRILL OIL PO Take 1 capsule by mouth daily at 12 noon.    [provider]  omeprazole (PRILOSEC) 40 MG capsule Take 40 mg by mouth 2 (two) times daily.    [provider]  predniSONE (DELTASONE) 20 MG tablet Take 20 mg by mouth 2 (two) times daily. 04/11/20   [provider]  traMADol (ULTRAM) 50 MG tablet Take 1 tablet (50 mg total) by mouth every 8 (eight) hours as needed for moderate pain or severe pain. 04/01/20   Tommie Sams, DO    Allergies Metformin and related and Reglan [metoclopramide]  Family History  Problem Relation Age of Onset  . Cancer Mother   . Diabetes Mother   . Cancer Father   . Breast cancer Neg Hx     Social History Social History   Tobacco Use  . Smoking status: Never Smoker  . Smokeless tobacco: Never Used  Vaping Use  . Vaping Use: Never used  Substance Use Topics  . Alcohol use: No  . Drug use: No     Review of Systems  Constitutional: No fever/chills.  Positive for fall striking her head. Eyes: No visual changes. No discharge ENT: No upper respiratory complaints. Cardiovascular: no chest pain. Respiratory: no cough. No SOB. Gastrointestinal: No abdominal pain.  No nausea, no vomiting.  No diarrhea.  No constipation. Musculoskeletal: Facial pain Skin: Facial abrasions Neurological: Negative for headaches, focal weakness or numbness.  10 System ROS otherwise negative.  ____________________________________________   PHYSICAL EXAM:  VITAL SIGNS: ED Triage Vitals  Enc Vitals Group     BP 10/06/20 2021 (!) 174/71     Pulse Rate 10/06/20 2021 88     Resp 10/06/20 2021 16     Temp 10/06/20 2021 98.1 F (36.7 C)     Temp Source 10/06/20 2021 Oral     SpO2 10/06/20 2021 97 %     Weight  10/06/20 2022 138 lb (62.6 kg)     Height 10/06/20 2022 5\' 6"  (1.676 m)     Head Circumference --      Peak Flow --      Pain Score 10/06/20 2022 9     Pain Loc --      Pain Edu? --      Excl. in GC? --      Constitutional: Alert and oriented. Well appearing and in no acute distress. Eyes: Conjunctivae are normal. PERRL. EOMI. Head: Multiple superficial abrasions noted to the nose and forehead.  No lacerations.  No bleeding.  No visible foreign body.  Patient is tender along the nasal bridge and forehead.  No palpable abnormalities.  No battle signs, raccoon eyes, serosanguineous fluid drainage from the ears or nares.  Other than nasal bridge and forehead  no tenderness to palpation of the osseous structures of the skull or face. ENT:      Ears:       Nose: No congestion/rhinnorhea.      Mouth/Throat: Mucous membranes are moist.  Neck: No stridor.  No cervical spine tenderness to palpation.  Cardiovascular: Normal rate, regular rhythm. Normal S1 and S2.  Good peripheral circulation. Respiratory: Normal respiratory effort without tachypnea or retractions. Lungs CTAB. Good air entry to the bases with no decreased or absent breath sounds. Musculoskeletal: Full range of motion to all extremities. No gross deformities appreciated. Neurologic:  Normal speech and language. No gross focal neurologic deficits are appreciated.  Cranial nerves II through XII grossly intact. Skin:  Skin is warm, dry and intact. No rash noted. Psychiatric: Mood and affect are normal. Speech and behavior are normal. Patient exhibits appropriate insight and judgement.   ____________________________________________   LABS (all labs ordered are listed, but only abnormal results are displayed)  Labs Reviewed  BASIC METABOLIC PANEL - Abnormal; Notable for the following components:      Result Value   Glucose, Bld 145 (*)    GFR, Estimated 59 (*)    All other components within normal limits  CBC WITH  DIFFERENTIAL/PLATELET  PROTIME-INR   ____________________________________________  EKG   ____________________________________________  RADIOLOGY I personally viewed and evaluated these images as part of my medical decision making, as well as reviewing the written report by the radiologist.  ED Provider Interpretation: Nasal fracture identified on CT.  No other acute traumatic findings  CT HEAD WO CONTRAST  Result Date: 10/06/2020 CLINICAL DATA:  Tripped and fell, facial pain and bleeding EXAM: CT HEAD WITHOUT CONTRAST TECHNIQUE: Contiguous axial images were obtained from the base of the skull through the vertex without intravenous contrast. COMPARISON:  None. FINDINGS: Brain: No acute infarct or hemorrhage. Lateral ventricles and midline structures are unremarkable. No acute extra-axial fluid collections. No mass effect. Vascular: No hyperdense vessel or unexpected calcification. Skull: Normal. Negative for fracture or focal lesion. Sinuses/Orbits: No acute finding. Other: None. IMPRESSION: 1. No acute intracranial process. Electronically Signed   By: Sharlet Salina M.D.   On: 10/06/2020 21:14   CT CERVICAL SPINE WO CONTRAST  Result Date: 10/06/2020 CLINICAL DATA:  Tripped and fell, facial pain and bleeding EXAM: CT CERVICAL SPINE WITHOUT CONTRAST TECHNIQUE: Multidetector CT imaging of the cervical spine was performed without intravenous contrast. Multiplanar CT image reconstructions were also generated. COMPARISON:  None. FINDINGS: Alignment: Minimal anterolisthesis of C3 on C4 likely due to facet hypertrophy and spondylosis. Otherwise alignment is anatomic. Skull base and vertebrae: No acute fracture. No primary bone lesion or focal pathologic process. Soft tissues and spinal canal: No prevertebral fluid or swelling. No visible canal hematoma. Disc levels: Multilevel spondylosis with disc space narrowing seen from C3-4 through C6-7. Facet hypertrophic changes are greatest at C2-3 and C3-4.  Symmetrical neural foraminal narrowing seen from C3-4 through C6-7. Upper chest: Airway is patent. Biapical pleural and parenchymal scarring. Other: Reconstructed images demonstrate no additional findings. IMPRESSION: 1. Extensive multilevel cervical spondylosis and facet hypertrophy. No acute fracture. Electronically Signed   By: Sharlet Salina M.D.   On: 10/06/2020 21:19   CT MAXILLOFACIAL WO CONTRAST  Result Date: 10/06/2020 CLINICAL DATA:  Tripped and fell, facial pain, bleeding EXAM: CT MAXILLOFACIAL WITHOUT CONTRAST TECHNIQUE: Multidetector CT imaging of the maxillofacial structures was performed. Multiplanar CT image reconstructions were also generated. COMPARISON:  None. FINDINGS: Osseous: Minimally displaced fracture seen at the tip of the  nasal bone, with overlying soft tissue swelling. No other acute facial bone fractures. Orbits: Negative. No traumatic or inflammatory finding. Sinuses: Clear. Soft tissues: Soft tissue swelling over the nasal bridge. No other abnormalities. Limited intracranial: No significant or unexpected finding. IMPRESSION: 1. Minimally displaced fracture at the tip of the nasal bone, with overlying soft tissue swelling. Electronically Signed   By: Sharlet SalinaMichael  Brown M.D.   On: 10/06/2020 21:15    ____________________________________________    PROCEDURES  Procedure(s) performed:    Procedures    Medications  Tdap (BOOSTRIX) injection 0.5 mL (has no administration in time range)  HYDROcodone-acetaminophen (NORCO/VICODIN) 5-325 MG per tablet 1 tablet (has no administration in time range)     ____________________________________________   INITIAL IMPRESSION / ASSESSMENT AND PLAN / ED COURSE  Pertinent labs & imaging results that were available during my care of the patient were reviewed by me and considered in my medical decision making (see chart for details).  Review of the  [place] CSRS was performed in accordance of the [facility] prior to dispensing any controlled  drugs.           Patient's diagnosis is consistent with fall, nasal fracture, facial abrasions.  Patient presented to emergency department after mechanical fall where she caught her toe on the exposed concrete falling forward striking her face.  No loss of consciousness.  Patient was neurologically intact.  Referred from urgent care for imaging as according to the medical record there was apparently no imaging this evening..  Imaging reveals a small nasal fracture.  This fracture underlies an abrasion but no frank lacerations.  No other concerning findings on imaging.  Patient is stable at this time.  Patient's tetanus shot is updated.  Patient will be discharged with small prescription for pain medication and antibiotics given the abrasion overlying nasal fracture..  Wound care instructions discussed with the patient.  Follow-up with primary care..  Patient is given ED precautions to return to the ED for any worsening or new symptoms.     ____________________________________________  FINAL CLINICAL IMPRESSION(S) / ED DIAGNOSES  Final diagnoses:  Fall, initial encounter  Open fracture of nasal bone, initial encounter      NEW MEDICATIONS STARTED DURING THIS VISIT:  ED Discharge Orders         Ordered    cephALEXin (KEFLEX) 500 MG capsule  2 times daily        10/06/20 2236    HYDROcodone-acetaminophen (NORCO/VICODIN) 5-325 MG tablet  Every 4 hours PRN        10/06/20 2236              This chart was dictated using voice recognition software/Dragon. Despite best efforts to proofread, errors can occur which can change the meaning. Any change was purely unintentional.    Racheal PatchesCuthriell, Demarea Lorey D, PA-C 10/06/20 2237    Phineas SemenGoodman, [place], MD 10/07/20 [phone]

## 2020-10-06 NOTE — ED Triage Notes (Signed)
Pt states that she fell at a parade, tripped on raised pavement. Pt states she is on Eliquis, denies LOC or dizziness, ambulatory to triage. Endorses forehead and nose pain, abrasion noted to bridge of nose and forehead with bleeding controlled.

## 2020-10-06 NOTE — ED Triage Notes (Signed)
Patient states that she was walking up to the [employer] and tripped and fell forward and landedon the concrete sidewalk. Patient c/o pain in her nose and forehead.  Patient has bleeding on her nose and forehead.  Patient denies LOC.

## 2020-10-06 NOTE — ED Provider Notes (Signed)
[facility]    CSN: [id number] Arrival date & time: 10/06/20  1908  History   Chief Complaint Chief Complaint  Patient presents with   Fall   Abrasion    facial (nose)   HPI  85 year old female presents with the above complaint.  Patient was walking up to the [employer] and suffered a fall on the curb.  She suffered facial abrasions.  She hit her nose and forehead.  She is on Eliquis.  No loss of consciousness.  She reports pain associated with abrasions.  Denies headache at this time.  Past Medical History:  Diagnosis Date   Anxiety    Arthritis    Diabetes mellitus without complication (HCC)    GERD (gastroesophageal reflux disease)    Heart murmur    Hypertension    Peripheral neuropathy     Patient Active Problem List   Diagnosis Date Noted   Atrial fibrillation (HCC) 04/19/2020   Hyponatremia 04/18/2020   Atrial fibrillation with RVR (HCC) 04/18/2020   Atrial fibrillation with rapid ventricular response (HCC) 04/18/2020   Diabetes mellitus without complication (HCC)    Hypertension     Past Surgical History:  Procedure Laterality Date   ABDOMINAL HYSTERECTOMY     BALLOON DILATION N/A 04/23/2019   Procedure: BALLOON DILATION;  Surgeon: Scot Jun, MD;  Location: [facility] ENDOSCOPY;  Service: Endoscopy;  Laterality: N/A;   CARPAL TUNNEL RELEASE     CHOLECYSTECTOMY     COLONOSCOPY WITH ESOPHAGOGASTRODUODENOSCOPY (EGD)     ESOPHAGOGASTRODUODENOSCOPY (EGD) WITH PROPOFOL N/A 04/23/2019   Procedure: ESOPHAGOGASTRODUODENOSCOPY (EGD) WITH PROPOFOL;  Surgeon: Scot Jun, MD;  Location: [facility] ENDOSCOPY;  Service: Endoscopy;  Laterality: N/A;   rotator cuff tear Right 2016   SHOULDER ARTHROSCOPY     SHOULDER ARTHROSCOPY Right 04/06/2015   Procedure: right shoulder arthroscopy, arthroscopic labral debridement, subacromial decompression, mini open rotator cuff repair;  Surgeon: Christena Flake, MD;  Location: [facility] ORS;   Service: Orthopedics;  Laterality: Right;    OB History   No obstetric history on file.      Home Medications    Prior to Admission medications   Medication Sig Start Date End Date Taking? Authorizing Provider  apixaban (ELIQUIS) 5 MG TABS tablet Take 1 tablet (5 mg total) by mouth 2 (two) times daily. 04/20/20 05/20/20  Charise Killian, MD  aspirin 81 MG tablet Take 81 mg by mouth daily.    [provider]  diltiazem (CARDIZEM CD) 180 MG 24 hr capsule Take 1 capsule (180 mg total) by mouth daily. 04/21/20 05/21/20  Charise Killian, MD  gabapentin (NEURONTIN) 600 MG tablet Take 600 mg by mouth 2 (two) times daily.    [provider]  glipiZIDE (GLUCOTROL XL) 5 MG 24 hr tablet Take 5 mg by mouth 2 (two) times daily. 01/20/20   [provider]  losartan (COZAAR) 50 MG tablet Take 1 tablet (50 mg total) by mouth daily. 04/20/20 05/20/20  Charise Killian, MD  lovastatin (MEVACOR) 20 MG tablet Take 20 mg by mouth daily at 12 noon.    [provider]  metoprolol succinate (TOPROL-XL) 50 MG 24 hr tablet Take 1 tablet (50 mg total) by mouth daily. Take with or immediately following a meal. 04/21/20 05/21/20  Charise Killian, MD  Multiple Vitamins-Minerals (CENTRUM SILVER ULTRA WOMENS PO) Take 1 capsule by mouth daily at 12 noon.    [provider]  OMEGA-3 KRILL OIL PO Take 1 capsule by  mouth daily at 12 noon.    [provider]  omeprazole (PRILOSEC) 40 MG capsule Take 40 mg by mouth 2 (two) times daily.    [provider]  predniSONE (DELTASONE) 20 MG tablet Take 20 mg by mouth 2 (two) times daily. 04/11/20   [provider]  traMADol (ULTRAM) 50 MG tablet Take 1 tablet (50 mg total) by mouth every 8 (eight) hours as needed for moderate pain or severe pain. 04/01/20   Tommie Sams, DO    Family History Family History  Problem Relation Age of Onset   Cancer Mother    Diabetes Mother    Cancer Father    Breast  cancer Neg Hx     Social History Social History   Tobacco Use   Smoking status: Never Smoker   Smokeless tobacco: Never Used  Vaping Use   Vaping Use: Never used  Substance Use Topics   Alcohol use: No   Drug use: No     Allergies   Metformin and related and Reglan [metoclopramide]   Review of Systems Review of Systems  Skin: Positive for wound.  Neurological: Negative for syncope.   Physical Exam Triage Vital Signs ED Triage Vitals  Enc Vitals Group     BP 10/06/20 1922 (!) 176/101     Pulse Rate 10/06/20 1920 98     Resp 10/06/20 1920 16     Temp 10/06/20 1920 98 F (36.7 C)     Temp Source 10/06/20 1920 Oral     SpO2 10/06/20 1920 95 %     Weight 10/06/20 1920 138 lb (62.6 kg)     Height 10/06/20 1920 5\' 6"  (1.676 m)     Head Circumference --      Peak Flow --      Pain Score 10/06/20 1915 8     Pain Loc --      Pain Edu? --      Excl. in GC? --    Updated Vital Signs BP (!) 176/101 (BP Location: Right Arm)    Pulse 96    Temp 98 F (36.7 C) (Oral)    Resp 14    Ht 5\' 6"  (1.676 m)    Wt 62.6 kg    SpO2 95%    BMI 22.27 kg/m   Visual Acuity Right Eye Distance:   Left Eye Distance:   Bilateral Distance:    Right Eye Near:   Left Eye Near:    Bilateral Near:     Physical Exam Vitals and nursing note reviewed.  Constitutional:      General: She is not in acute distress. Skin:    Comments: Abrasions noted to the face.  Neurological:     General: No focal deficit present.     Mental Status: She is alert and oriented to person, place, and time.     Cranial Nerves: No cranial nerve deficit.     Motor: No weakness.  Psychiatric:        Mood and Affect: Mood normal.        Behavior: Behavior normal.    UC Treatments / Results  Labs (all labs ordered are listed, but only abnormal results are displayed) Labs Reviewed - No data to display  EKG   Radiology No results found.  Procedures Procedures (including critical care  time)  Medications Ordered in UC Medications - No data to display  Initial Impression / Assessment and Plan / UC Course  I have reviewed the  triage vital signs and the nursing notes.  Pertinent labs & imaging results that were available during my care of the patient were reviewed by me and considered in my medical decision making (see chart for details).    85 year old female presents with head injury and abrasions to the face.  Evaluated quickly and sent directly to the ER for imaging as CT is not available here tonight (given injury, age, and chronic anticoagulation).   Final Clinical Impressions(s) / UC Diagnoses   Final diagnoses:  Injury of head, initial encounter  Abrasion of face, initial encounter   Discharge Instructions   None    ED Prescriptions    None     PDMP not reviewed this encounter.   Tommie Sams, [place] 10/06/20 1935

## 2020-10-06 NOTE — ED Notes (Signed)
Patient is being discharged from the Urgent Care and sent to the [facility] Emergency Department via private vehicle with her son. Per Dr. Adriana Simas, patient is in need of higher level of care due to fall, head injury and patient being on a blood thinner. Patient is aware and verbalizes understanding of plan of care.  Vitals:   10/06/20 1920 10/06/20 1922  BP:  (!) 176/101  Pulse: 98 96  Resp: 16 14  Temp: 98 F (36.7 C) 98 F (36.7 C)  SpO2: 95% 95%

## 2020-11-30 ENCOUNTER — Other Ambulatory Visit: Payer: Self-pay | Admitting: Family Medicine

## 2020-11-30 ENCOUNTER — Other Ambulatory Visit (HOSPITAL_COMMUNITY): Payer: Self-pay | Admitting: Family Medicine

## 2020-11-30 DIAGNOSIS — R2681 Unsteadiness on feet: Secondary | ICD-10-CM

## 2020-11-30 DIAGNOSIS — R519 Headache, unspecified: Secondary | ICD-10-CM

## 2021-10-26 ENCOUNTER — Other Ambulatory Visit: Payer: Self-pay

## 2021-10-26 ENCOUNTER — Encounter: Payer: Self-pay | Admitting: Emergency Medicine

## 2021-10-26 ENCOUNTER — Ambulatory Visit
Admission: EM | Admit: 2021-10-26 | Discharge: 2021-10-26 | Disposition: A | Discharge status: Home / Self Care | Payer: Medicare Other | Attending: Emergency Medicine | Admitting: Emergency Medicine

## 2021-10-26 DIAGNOSIS — B309 Viral conjunctivitis, unspecified: Secondary | ICD-10-CM

## 2021-10-26 MED ORDER — KETOROLAC TROMETHAMINE 0.5 % OP SOLN: 1.0000 [drp] | Freq: Four times a day (QID) | OPHTHALMIC | 0 refills | Status: DC

## 2021-10-26 MED ORDER — AZELASTINE HCL 0.05 % OP SOLN: 1.0000 [drp] | Freq: Two times a day (BID) | OPHTHALMIC | 0 refills | Status: DC

## 2021-10-26 NOTE — ED Triage Notes (Signed)
Patient c/o sinus congestion and pressure, runny nose, and itchy watery eyes that started 3 days ago.  Patient denies fevers.  Patient did a home covid test and was negative.

## 2021-10-26 NOTE — ED Provider Notes (Signed)
[facility]  ____________________________________________  Time seen: Approximately 12:47 PM  I have reviewed the triage vital signs and the nursing notes.   HISTORY  Chief Complaint Nasal Congestion and Eye Drainage    HPI Dawn Farmer is a 86 y.o. female who presents the emergency department complaining of bilateral eye redness.  She states that she has a scratchy throat but no other symptoms.  She denies headache, visual changes, purulent drainage from her eyes, nasal congestion, shortness of breath, chest pain, GI symptoms.  She has a chronic cough but states there is been no changes in her cough.  She is primarily concerned as both of her eyes are red.  Again no drainage from her eyes.  She states that they are dry/burning sensation.  She does not wear glasses or contacts.       Past Medical History:  Diagnosis Date   Anxiety    Arthritis    Diabetes mellitus without complication (HCC)    GERD (gastroesophageal reflux disease)    Heart murmur    Hypertension    Peripheral neuropathy     Patient Active Problem List   Diagnosis Date Noted   Atrial fibrillation (HCC) 04/19/2020   Hyponatremia 04/18/2020   Atrial fibrillation with RVR (HCC) 04/18/2020   Atrial fibrillation with rapid ventricular response (HCC) 04/18/2020   Diabetes mellitus without complication (HCC)    Hypertension     Past Surgical History:  Procedure Laterality Date   ABDOMINAL HYSTERECTOMY     BALLOON DILATION N/A 04/23/2019   Procedure: BALLOON DILATION;  Surgeon: Scot Jun, MD;  Location: [facility] ENDOSCOPY;  Service: Endoscopy;  Laterality: N/A;   CARPAL TUNNEL RELEASE     CHOLECYSTECTOMY     COLONOSCOPY WITH ESOPHAGOGASTRODUODENOSCOPY (EGD)     ESOPHAGOGASTRODUODENOSCOPY (EGD) WITH PROPOFOL N/A 04/23/2019   Procedure: ESOPHAGOGASTRODUODENOSCOPY (EGD) WITH PROPOFOL;  Surgeon: Scot Jun, MD;  Location: [facility] ENDOSCOPY;  Service: Endoscopy;  Laterality: N/A;   rotator  cuff tear Right 2016   SHOULDER ARTHROSCOPY     SHOULDER ARTHROSCOPY Right 04/06/2015   Procedure: right shoulder arthroscopy, arthroscopic labral debridement, subacromial decompression, mini open rotator cuff repair;  Surgeon: Christena Flake, MD;  Location: [facility] ORS;  Service: Orthopedics;  Laterality: Right;    Prior to Admission medications   Medication Sig Start Date End Date Taking? Authorizing Provider  apixaban (ELIQUIS) 5 MG TABS tablet Take 1 tablet (5 mg total) by mouth 2 (two) times daily. 04/20/20 10/26/21 Yes Charise Killian, MD  azelastine (OPTIVAR) 0.05 % ophthalmic solution Place 1 drop into the left eye 2 (two) times daily. 10/26/21  Yes Kayin Osment, Delorise Royals, PA-C  diltiazem (CARDIZEM CD) 180 MG 24 hr capsule Take 1 capsule (180 mg total) by mouth daily. 04/21/20 10/26/21 Yes Charise Killian, MD  gabapentin (NEURONTIN) 600 MG tablet Take 600 mg by mouth 2 (two) times daily.   Yes [provider]  glipiZIDE (GLUCOTROL XL) 5 MG 24 hr tablet Take 5 mg by mouth 2 (two) times daily. 01/20/20  Yes [provider]  ketorolac (ACULAR) 0.5 % ophthalmic solution Place 1 drop into both eyes 4 (four) times daily. 10/26/21  Yes Taiden Raybourn, Delorise Royals, PA-C  losartan (COZAAR) 50 MG tablet Take 1 tablet (50 mg total) by mouth daily. 04/20/20 10/26/21 Yes Charise Killian, MD  lovastatin (MEVACOR) 20 MG tablet Take 20 mg by mouth daily at 12 noon.   Yes [provider]  metoprolol succinate (TOPROL-XL) 50 MG 24  hr tablet Take 1 tablet (50 mg total) by mouth daily. Take with or immediately following a meal. 04/21/20 10/26/21 Yes Charise Killian, MD  Multiple Vitamins-Minerals (CENTRUM SILVER ULTRA WOMENS PO) Take 1 capsule by mouth daily at 12 noon.   Yes [provider]  OMEGA-3 KRILL OIL PO Take 1 capsule by mouth daily at 12 noon.   Yes [provider]  omeprazole (PRILOSEC) 40 MG capsule Take 40 mg by mouth 2 (two) times daily.   Yes [provider]  aspirin 81 MG tablet Take 81 mg by mouth daily.    [provider]  cephALEXin (KEFLEX) 500 MG capsule Take 2 capsules (1,000 mg total) by mouth 2 (two) times daily. 10/06/20   Roark Rufo, Delorise Royals, PA-C  HYDROcodone-acetaminophen (NORCO/VICODIN) 5-325 MG tablet Take 1 tablet by mouth every 4 (four) hours as needed for moderate pain. 10/06/20   Braylen Denunzio, Delorise Royals, PA-C  predniSONE (DELTASONE) 20 MG tablet Take 20 mg by mouth 2 (two) times daily. 04/11/20   [provider]  traMADol (ULTRAM) 50 MG tablet Take 1 tablet (50 mg total) by mouth every 8 (eight) hours as needed for moderate pain or severe pain. 04/01/20   Tommie Sams, DO    Allergies Metformin and related and Reglan [metoclopramide]  Family History  Problem Relation Age of Onset   Cancer Mother    Diabetes Mother    Cancer Father    Breast cancer Neg Hx     Social History Social History   Tobacco Use   Smoking status: Never   Smokeless tobacco: Never  Vaping Use   Vaping Use: Never used  Substance Use Topics   Alcohol use: No   Drug use: No     Review of Systems  Constitutional: No fever/chills Eyes: No visual changes. No discharge.  Bilateral eye irritation ENT: No upper respiratory complaints. Cardiovascular: no chest pain. Respiratory: no cough. No SOB. Gastrointestinal: No abdominal pain.  No nausea, no vomiting.  No diarrhea.  No constipation. Musculoskeletal: Negative for musculoskeletal pain. Skin: Negative for rash, abrasions, lacerations, ecchymosis. Neurological: Negative for headaches, focal weakness or numbness.  10 System ROS otherwise negative.  ____________________________________________   PHYSICAL EXAM:  VITAL SIGNS: ED Triage Vitals  Enc Vitals Group     BP 10/26/21 1148 (!) 182/77     Pulse Rate 10/26/21 1148 95     Resp 10/26/21 1148 14     Temp 10/26/21 1148 98.4 F (36.9 C)     Temp Source 10/26/21 1148 Oral     SpO2 10/26/21 1148 98 %      Weight 10/26/21 1144 143 lb (64.9 kg)     Height 10/26/21 1144 5\' 6"  (1.676 m)     Head Circumference --      Peak Flow --      Pain Score 10/26/21 1144 0     Pain Loc --      Pain Edu? --      Excl. in GC? --      Constitutional: Alert and oriented. Well appearing and in no acute distress. Eyes: Conjunctivae are erythematous bilaterally.  No purulent drainage identified on the eyelashes or in the eye.  PERRL. EOMI. funduscopic exam is reassuring with red reflex, vasculature and optic disc visualized without acute abnormality. Head: Atraumatic. ENT:      Ears:       Nose: No congestion/rhinnorhea.      Mouth/Throat: Mucous membranes are moist.  Oropharynx is slightly  erythematous but nonedematous.  Uvula is midline.  No exudates. Neck: No stridor.  Neck is supple full range of motion  Cardiovascular: Normal rate, regular rhythm. Normal S1 and S2.  Good peripheral circulation. Respiratory: Normal respiratory effort without tachypnea or retractions. Lungs CTAB. Good air entry to the bases with no decreased or absent breath sounds. Musculoskeletal: Full range of motion to all extremities. No gross deformities appreciated. Neurologic:  Normal speech and language. No gross focal neurologic deficits are appreciated.  Skin:  Skin is warm, dry and intact. No rash noted. Psychiatric: Mood and affect are normal. Speech and behavior are normal. Patient exhibits appropriate insight and judgement.   ____________________________________________   LABS (all labs ordered are listed, but only abnormal results are displayed)  Labs Reviewed - No data to display ____________________________________________  EKG   ____________________________________________  RADIOLOGY   No results found.  ____________________________________________    PROCEDURES  Procedure(s) performed:    Procedures    Medications - No data to  display   ____________________________________________   INITIAL IMPRESSION / ASSESSMENT AND PLAN / ED COURSE  Pertinent labs & imaging results that were available during my care of the patient were reviewed by me and considered in my medical decision making (see chart for details).  Review of the [place] CSRS was performed in accordance of the [facility] prior to dispensing any controlled drugs.           Patient's diagnosis is consistent with viral conjunctivitis.  Patient presents emergency department complaining of bilateral eye irritation.  Symptoms have been ongoing x2 days.  She has a slight scratchy throat but no difficulty breathing or swallowing.  No other associated URI symptoms with fevers, chills, nasal congestion, GI symptoms.  Patient has a chronic cough that has not changed..  She denies any sick contacts.  Patient was treated with symptom control medicines of Acular and Optivar for symptom relief.  Follow-up with primary care as needed.  Patient is given ED precautions to return to the ED for any worsening or new symptoms.     ____________________________________________  FINAL CLINICAL IMPRESSION(S) / DIAGNOSES  Final diagnoses:  Viral conjunctivitis      NEW MEDICATIONS STARTED DURING THIS VISIT:  ED Discharge Orders          Ordered    ketorolac (ACULAR) 0.5 % ophthalmic solution  4 times daily        10/26/21 1258    azelastine (OPTIVAR) 0.05 % ophthalmic solution  2 times daily        10/26/21 1258                This chart was dictated using voice recognition software/Dragon. Despite best efforts to proofread, errors can occur which can change the meaning. Any change was purely unintentional.    Racheal Patches, PA-C 10/26/21 1259

## 2022-01-01 ENCOUNTER — Ambulatory Visit
Admission: RE | Admit: 2022-01-01 | Discharge: 2022-01-01 | Disposition: A | Discharge status: Home / Self Care | Payer: Medicare Other | Attending: Family Medicine | Admitting: Family Medicine

## 2022-01-01 ENCOUNTER — Ambulatory Visit
Admission: RE | Admit: 2022-01-01 | Discharge: 2022-01-01 | Disposition: A | Discharge status: Home / Self Care | Payer: Medicare Other | Source: Ambulatory Visit | Attending: Family Medicine | Admitting: Family Medicine

## 2022-01-01 ENCOUNTER — Other Ambulatory Visit: Payer: Self-pay

## 2022-01-01 ENCOUNTER — Other Ambulatory Visit: Payer: Self-pay | Admitting: Family Medicine

## 2022-01-01 DIAGNOSIS — M5416 Radiculopathy, lumbar region: Secondary | ICD-10-CM

## 2022-01-26 ENCOUNTER — Inpatient Hospital Stay
Admission: EM | Admit: 2022-01-26 | Discharge: 2022-01-29 | DRG: 392 | Disposition: A | Discharge status: Home / Self Care | Payer: Medicare Other | Attending: Internal Medicine | Admitting: Internal Medicine

## 2022-01-26 ENCOUNTER — Encounter: Payer: Self-pay | Admitting: Internal Medicine

## 2022-01-26 ENCOUNTER — Other Ambulatory Visit: Payer: Self-pay

## 2022-01-26 ENCOUNTER — Emergency Department: Payer: Medicare Other

## 2022-01-26 DIAGNOSIS — Z7982 Long term (current) use of aspirin: Secondary | ICD-10-CM

## 2022-01-26 DIAGNOSIS — Z9049 Acquired absence of other specified parts of digestive tract: Secondary | ICD-10-CM

## 2022-01-26 DIAGNOSIS — Z66 Do not resuscitate: Secondary | ICD-10-CM | POA: Diagnosis present

## 2022-01-26 DIAGNOSIS — R1031 Right lower quadrant pain: Secondary | ICD-10-CM

## 2022-01-26 DIAGNOSIS — R109 Unspecified abdominal pain: Secondary | ICD-10-CM | POA: Diagnosis not present

## 2022-01-26 DIAGNOSIS — R8271 Bacteriuria: Secondary | ICD-10-CM | POA: Diagnosis present

## 2022-01-26 DIAGNOSIS — E785 Hyperlipidemia, unspecified: Secondary | ICD-10-CM | POA: Diagnosis present

## 2022-01-26 DIAGNOSIS — Z7984 Long term (current) use of oral hypoglycemic drugs: Secondary | ICD-10-CM

## 2022-01-26 DIAGNOSIS — R197 Diarrhea, unspecified: Secondary | ICD-10-CM

## 2022-01-26 DIAGNOSIS — E860 Dehydration: Secondary | ICD-10-CM | POA: Diagnosis present

## 2022-01-26 DIAGNOSIS — I482 Chronic atrial fibrillation, unspecified: Secondary | ICD-10-CM | POA: Diagnosis not present

## 2022-01-26 DIAGNOSIS — A0811 Acute gastroenteropathy due to Norwalk agent: Principal | ICD-10-CM | POA: Diagnosis present

## 2022-01-26 DIAGNOSIS — Z7901 Long term (current) use of anticoagulants: Secondary | ICD-10-CM

## 2022-01-26 DIAGNOSIS — E119 Type 2 diabetes mellitus without complications: Secondary | ICD-10-CM

## 2022-01-26 DIAGNOSIS — Z833 Family history of diabetes mellitus: Secondary | ICD-10-CM

## 2022-01-26 DIAGNOSIS — K529 Noninfective gastroenteritis and colitis, unspecified: Secondary | ICD-10-CM | POA: Diagnosis not present

## 2022-01-26 DIAGNOSIS — R112 Nausea with vomiting, unspecified: Secondary | ICD-10-CM

## 2022-01-26 DIAGNOSIS — I10 Essential (primary) hypertension: Secondary | ICD-10-CM | POA: Diagnosis present

## 2022-01-26 DIAGNOSIS — K219 Gastro-esophageal reflux disease without esophagitis: Secondary | ICD-10-CM | POA: Diagnosis present

## 2022-01-26 DIAGNOSIS — Z20822 Contact with and (suspected) exposure to covid-19: Secondary | ICD-10-CM | POA: Diagnosis present

## 2022-01-26 DIAGNOSIS — B961 Klebsiella pneumoniae [K. pneumoniae] as the cause of diseases classified elsewhere: Secondary | ICD-10-CM | POA: Diagnosis present

## 2022-01-26 DIAGNOSIS — E1140 Type 2 diabetes mellitus with diabetic neuropathy, unspecified: Secondary | ICD-10-CM | POA: Diagnosis present

## 2022-01-26 DIAGNOSIS — Z79899 Other long term (current) drug therapy: Secondary | ICD-10-CM

## 2022-01-26 DIAGNOSIS — G8929 Other chronic pain: Secondary | ICD-10-CM | POA: Diagnosis present

## 2022-01-26 DIAGNOSIS — Z90710 Acquired absence of both cervix and uterus: Secondary | ICD-10-CM

## 2022-01-26 LAB — URINALYSIS, ROUTINE W REFLEX MICROSCOPIC
Bilirubin Urine: NEGATIVE
Glucose, UA: NEGATIVE mg/dL
Hgb urine dipstick: NEGATIVE
Ketones, ur: NEGATIVE mg/dL
Leukocytes,Ua: NEGATIVE
Nitrite: POSITIVE — AB
Protein, ur: NEGATIVE mg/dL
Specific Gravity, Urine: >1.046 — ABNORMAL HIGH (ref 1.005–1.030)
pH: 6 (ref 5.0–8.0)

## 2022-01-26 LAB — CBC WITH DIFFERENTIAL/PLATELET
Abs Immature Granulocytes: 0.03 10*3/uL (ref 0.00–0.07)
Basophils Absolute: 0 10*3/uL (ref 0.0–0.1)
Basophils Relative: 0 %
Eosinophils Absolute: 0 10*3/uL (ref 0.0–0.5)
Eosinophils Relative: 0 %
HCT: 40.3 % (ref 36.0–46.0)
Hemoglobin: 13.2 g/dL (ref 12.0–15.0)
Immature Granulocytes: 1 %
Lymphocytes Relative: 16 %
Lymphs Abs: 1 10*3/uL (ref 0.7–4.0)
MCH: 29.3 pg (ref 26.0–34.0)
MCHC: 32.8 g/dL (ref 30.0–36.0)
MCV: 89.6 fL (ref 80.0–100.0)
Monocytes Absolute: 0.7 10*3/uL (ref 0.1–1.0)
Monocytes Relative: 10 %
Neutro Abs: 4.7 10*3/uL (ref 1.7–7.7)
Neutrophils Relative %: 73 %
Platelets: 345 10*3/uL (ref 150–400)
RBC: 4.5 MIL/uL (ref 3.87–5.11)
RDW: 13.1 % (ref 11.5–15.5)
WBC: 6.4 10*3/uL (ref 4.0–10.5)
nRBC: 0 % (ref 0.0–0.2)

## 2022-01-26 LAB — RESP PANEL BY RT-PCR (FLU A&B, COVID) ARPGX2
Influenza A by PCR: NEGATIVE
Influenza B by PCR: NEGATIVE
SARS Coronavirus 2 by RT PCR: NEGATIVE

## 2022-01-26 LAB — COMPREHENSIVE METABOLIC PANEL
ALT: 21 U/L (ref 0–44)
AST: 37 U/L (ref 15–41)
Albumin: 3.3 g/dL — ABNORMAL LOW (ref 3.5–5.0)
Alkaline Phosphatase: 64 U/L (ref 38–126)
Anion gap: 9 (ref 5–15)
BUN: 21 mg/dL (ref 8–23)
CO2: 23 mmol/L (ref 22–32)
Calcium: 8.8 mg/dL — ABNORMAL LOW (ref 8.9–10.3)
Chloride: 105 mmol/L (ref 98–111)
Creatinine, Ser: 0.81 mg/dL (ref 0.44–1.00)
GFR, Estimated: >60 mL/min (ref >60)
Glucose, Bld: 179 mg/dL — ABNORMAL HIGH (ref 70–99)
Potassium: 5.1 mmol/L (ref 3.5–5.1)
Sodium: 137 mmol/L (ref 135–145)
Total Bilirubin: 1.1 mg/dL (ref 0.3–1.2)
Total Protein: 7.1 g/dL (ref 6.5–8.1)

## 2022-01-26 LAB — TROPONIN I (HIGH SENSITIVITY)
Troponin I (High Sensitivity): 7 ng/L (ref <18)
Troponin I (High Sensitivity): 7 ng/L (ref <18)

## 2022-01-26 LAB — GLUCOSE, CAPILLARY
Glucose-Capillary: 132 mg/dL — ABNORMAL HIGH (ref 70–99)
Glucose-Capillary: 148 mg/dL — ABNORMAL HIGH (ref 70–99)

## 2022-01-26 LAB — MAGNESIUM: Magnesium: 1.8 mg/dL (ref 1.7–2.4)

## 2022-01-26 LAB — LACTIC ACID, PLASMA
Lactic Acid, Venous: 1.7 mmol/L (ref 0.5–1.9)
Lactic Acid, Venous: 1.6 mmol/L (ref 0.5–1.9)

## 2022-01-26 MED ORDER — INSULIN ASPART 100 UNIT/ML IJ SOLN: 0.0000 [IU] | Freq: Every day | INTRAMUSCULAR | Status: DC

## 2022-01-26 MED ORDER — HYDRALAZINE HCL 20 MG/ML IJ SOLN
5.0000 mg | INTRAMUSCULAR | Status: DC | PRN
  Administered 2022-01-26: 5 mg via INTRAVENOUS
  Filled 2022-01-26: qty 1

## 2022-01-26 MED ORDER — MORPHINE SULFATE (PF) 2 MG/ML IV SOLN
0.5000 mg | INTRAVENOUS | Status: DC | PRN
  Administered 2022-01-26: 0.5 mg via INTRAVENOUS
  Filled 2022-01-26: qty 1

## 2022-01-26 MED ORDER — SODIUM CHLORIDE 0.9 % IV SOLN: INTRAVENOUS | Status: DC

## 2022-01-26 MED ORDER — METOPROLOL SUCCINATE ER 50 MG PO TB24
50.0000 mg | ORAL_TABLET | Freq: Every day | ORAL | Status: DC
  Administered 2022-01-27 – 2022-01-29 (×3): 50 mg via ORAL
  Filled 2022-01-26 (×3): qty 1

## 2022-01-26 MED ORDER — ACETAMINOPHEN 325 MG PO TABS
650.0000 mg | ORAL_TABLET | Freq: Four times a day (QID) | ORAL | Status: DC | PRN
  Administered 2022-01-28: 650 mg via ORAL
  Filled 2022-01-26: qty 2

## 2022-01-26 MED ORDER — INSULIN ASPART 100 UNIT/ML IJ SOLN
0.0000 [IU] | Freq: Three times a day (TID) | INTRAMUSCULAR | Status: DC
  Administered 2022-01-27 (×2): 1 [IU] via SUBCUTANEOUS
  Administered 2022-01-27 – 2022-01-28 (×2): 2 [IU] via SUBCUTANEOUS
  Administered 2022-01-28: 1 [IU] via SUBCUTANEOUS
  Administered 2022-01-28 – 2022-01-29 (×2): 2 [IU] via SUBCUTANEOUS
  Administered 2022-01-29: 1 [IU] via SUBCUTANEOUS
  Filled 2022-01-26 (×8): qty 1

## 2022-01-26 MED ORDER — APIXABAN 5 MG PO TABS
5.0000 mg | ORAL_TABLET | Freq: Two times a day (BID) | ORAL | Status: DC
  Administered 2022-01-26 – 2022-01-29 (×6): 5 mg via ORAL
  Filled 2022-01-26 (×6): qty 1

## 2022-01-26 MED ORDER — FENTANYL CITRATE PF 50 MCG/ML IJ SOSY
50.0000 ug | PREFILLED_SYRINGE | Freq: Once | INTRAMUSCULAR | Status: AC
  Administered 2022-01-26: 50 ug via INTRAVENOUS
  Filled 2022-01-26: qty 1

## 2022-01-26 MED ORDER — ONDANSETRON HCL 4 MG/2ML IJ SOLN
4.0000 mg | Freq: Three times a day (TID) | INTRAMUSCULAR | Status: DC | PRN
  Administered 2022-01-26: 4 mg via INTRAVENOUS
  Filled 2022-01-26: qty 2

## 2022-01-26 MED ORDER — OXYCODONE-ACETAMINOPHEN 5-325 MG PO TABS
1.0000 | ORAL_TABLET | ORAL | Status: DC | PRN
  Administered 2022-01-27 – 2022-01-28 (×3): 1 via ORAL
  Filled 2022-01-26 (×5): qty 1

## 2022-01-26 MED ORDER — IOHEXOL 300 MG/ML  SOLN
100.0000 mL | Freq: Once | INTRAMUSCULAR | Status: AC | PRN
  Administered 2022-01-26: 100 mL via INTRAVENOUS

## 2022-01-26 MED ORDER — SODIUM CHLORIDE 0.9 % IV BOLUS
1000.0000 mL | Freq: Once | INTRAVENOUS | Status: AC
  Administered 2022-01-26: 1000 mL via INTRAVENOUS

## 2022-01-26 MED ORDER — GABAPENTIN 600 MG PO TABS
600.0000 mg | ORAL_TABLET | Freq: Two times a day (BID) | ORAL | Status: DC
  Administered 2022-01-26 – 2022-01-29 (×6): 600 mg via ORAL
  Filled 2022-01-26 (×6): qty 1

## 2022-01-26 MED ORDER — LOSARTAN POTASSIUM 50 MG PO TABS
100.0000 mg | ORAL_TABLET | Freq: Every day | ORAL | Status: DC
  Administered 2022-01-27 – 2022-01-29 (×3): 100 mg via ORAL
  Filled 2022-01-26 (×3): qty 2

## 2022-01-26 MED ORDER — PANTOPRAZOLE SODIUM 40 MG PO TBEC
40.0000 mg | DELAYED_RELEASE_TABLET | Freq: Every day | ORAL | Status: DC
Start: 2022-01-26 — End: 2022-01-29
  Administered 2022-01-27 – 2022-01-29 (×3): 40 mg via ORAL
  Filled 2022-01-26 (×3): qty 1

## 2022-01-26 MED ORDER — ADULT MULTIVITAMIN W/MINERALS CH
ORAL_TABLET | Freq: Every day | ORAL | Status: DC
Start: 2022-01-27 — End: 2022-01-29
  Administered 2022-01-27 – 2022-01-29 (×3): 1 via ORAL
  Filled 2022-01-26 (×3): qty 1

## 2022-01-26 MED ORDER — ONDANSETRON HCL 4 MG/2ML IJ SOLN
4.0000 mg | Freq: Once | INTRAMUSCULAR | Status: AC
  Administered 2022-01-26: 4 mg via INTRAVENOUS
  Filled 2022-01-26: qty 2

## 2022-01-26 MED ORDER — DILTIAZEM HCL ER COATED BEADS 180 MG PO CP24
180.0000 mg | ORAL_CAPSULE | Freq: Every day | ORAL | Status: DC
Start: 2022-01-26 — End: 2022-01-29
  Administered 2022-01-27 – 2022-01-29 (×3): 180 mg via ORAL
  Filled 2022-01-26 (×4): qty 1

## 2022-01-26 MED ORDER — PRAVASTATIN SODIUM 20 MG PO TABS
20.0000 mg | ORAL_TABLET | Freq: Every day | ORAL | Status: DC
Start: 2022-01-26 — End: 2022-01-29
  Administered 2022-01-27 – 2022-01-28 (×2): 20 mg via ORAL
  Filled 2022-01-26 (×2): qty 1

## 2022-01-26 MED ORDER — FENTANYL CITRATE PF 50 MCG/ML IJ SOSY
PREFILLED_SYRINGE | INTRAMUSCULAR | Status: AC
  Filled 2022-01-26: qty 1

## 2022-01-26 MED ORDER — FENTANYL CITRATE PF 50 MCG/ML IJ SOSY
50.0000 ug | PREFILLED_SYRINGE | Freq: Once | INTRAMUSCULAR | Status: AC
  Administered 2022-01-26: 50 ug via INTRAVENOUS

## 2022-01-26 MED ORDER — OMEGA-3-ACID ETHYL ESTERS 1 G PO CAPS
1.0000 | ORAL_CAPSULE | Freq: Every day | ORAL | Status: DC
  Administered 2022-01-27 – 2022-01-29 (×3): 1 g via ORAL
  Filled 2022-01-26 (×3): qty 1

## 2022-01-26 NOTE — ED Triage Notes (Signed)
Pt BIB EMS from home c/o constant mid abdominal pain, n/v/d x 2 days. Had a steroid injection on Wednesday for her hip. EMS gave pt Zofran 4mg  . H/O Afib, HTN, DM, GERD ? ?186/100 ?HR 112 ?99% RA ?CBG 195 ?Rt hand 22g ?

## 2022-01-26 NOTE — ED Provider Notes (Signed)
? ?[facility] ?Provider Note ? ? ? Event Date/Time  ? First MD Initiated Contact with Patient 01/26/22 1043   ?  (approximate) ? ? ?History  ? ?Abdominal Pain ? ? ?HPI ? ?Dawn Farmer is a 87 y.o. female who comes with a history of diabetes, anxiety, hypertension who now comes in with abdominal pain.  Patient reports epigastric abdominal pain for the past 2 to 3 days with nausea, vomiting, diarrhea.  She denies it being bloody or bilious.  She reports multiple episodes of diarrhea.  She reports taking Imodium without any relief.  Reports that she not been able to keep down any of her medications or any food for the past 2 days.  She denies any chest pain, shortness of breath, leg swelling or other concerns ? ?Physical Exam  ? ?Triage Vital Signs: ?ED Triage Vitals  ?Enc Vitals Group  ?   BP 01/26/22 1044 (!) 152/74  ?   Pulse Rate 01/26/22 1044 (!) 113  ?   Resp 01/26/22 1044 18  ?   Temp --   ?   Temp src --   ?   SpO2 01/26/22 1042 99 %  ?   Weight 01/26/22 1044 138 lb (62.6 kg)  ?   Height 01/26/22 1044 5\' 3"  (1.6 m)  ?   Head Circumference --   ?   Peak Flow --   ?   Pain Score 01/26/22 1043 8  ?   Pain Loc --   ?   Pain Edu? --   ?   Excl. in GC? --   ? ? ?Most recent vital signs: ?Vitals:  ? 01/26/22 1042 01/26/22 1044  ?BP:  (!) 152/74  ?Pulse:  (!) 113  ?Resp:  18  ?SpO2: 99% 100%  ? ? ? ?General: Awake, no distress.  ?CV:  Good peripheral perfusion. Tachy  ?Resp:  Normal effort.  ?Abd:  No distention.  Tender epigastrically and a little bit of right lower quadrant ? ? ? ?ED Results / Procedures / Treatments  ? ?Labs ?(all labs ordered are listed, but only abnormal results are displayed) ?Labs Reviewed  ?RESP PANEL BY RT-PCR (FLU A&B, COVID) [id number]  ?GASTROINTESTINAL PANEL BY PCR, STOOL (REPLACES STOOL CULTURE)  ?C DIFFICILE QUICK SCREEN W PCR REFLEX    ?CBC WITH DIFFERENTIAL/PLATELET  ?COMPREHENSIVE METABOLIC PANEL  ?MAGNESIUM  ?TROPONIN I (HIGH SENSITIVITY)  ? ? ? ?EKG ? ?My  interpretation of EKG: ? ?Sinus tachycardia rate of 115 without any ST elevation or T wave inversions, normal intervals ? ?RADIOLOGY ?I have reviewed the CT and see some evidence of a little bit of diarrheal state but no otherwise reassuring pending read ? ? ?PROCEDURES: ? ?Critical Care performed: No ? ?.1-3 Lead EKG Interpretation ?Performed by: 01/28/22, MD ?Authorized by: Concha Se, MD  ? ?  Interpretation: normal   ?  ECG rate:  110 ?  ECG rate assessment: tachycardic   ?  Rhythm: sinus tachycardia   ?  Ectopy: none   ?  Conduction: normal   ? ? ?MEDICATIONS ORDERED IN ED: ?Medications  ?sodium chloride 0.9 % bolus 1,000 mL (1,000 mLs Intravenous New Bag/Given 01/26/22 1104)  ?fentaNYL (SUBLIMAZE) injection 50 mcg (50 mcg Intravenous Given 01/26/22 1104)  ?ondansetron (ZOFRAN) injection 4 mg (4 mg Intravenous Given 01/26/22 1104)  ?iohexol (OMNIPAQUE) 300 MG/ML solution 100 mL (100 mLs Intravenous Contrast Given 01/26/22 1217)  ? ? ? ?IMPRESSION / MDM / ASSESSMENT AND  PLAN / ED COURSE  ?I reviewed the triage vital signs and the nursing notes. ?             ?               ? ?I reviewed patient's cardiology note from December 2022 where she has a history of atrial fibrillation which she is on Eliquis for. ? ?Patient has a history of A-fib on Eliquis, diabetes, hypertension who comes in with concerns for nausea vomiting diarrhea abdominal pain.  Likely today EKG shows sinus tachycardia but she does not appear in A-fib.  We will get labs to evaluate for Electra abnormalities, AKI.  Patient does have some lower abdominal tenderness and therefore we will proceed with CT to make sure evidence appendicitis, diverticulitis obstruction, perforation or other acute pathology.  Given patient's age and comorbidities we discussed getting stool studies, GI pathogens given if they were positive I would want to treat her as well as C. difficile testing.  We will give patient 1 L of fluids, IV Zofran, IV fentanyl to help  with symptoms. ? ?Magnesium is normal.  CBC is normal.  CMP shows slightly low albumin, calcium but overall reassuring.  Troponin is negative and symptoms have been going on for long period of time so doubt atypical presentation of ACS.  COVID, flu were negative ? ? ?IMPRESSION: ?1. Mild bowel wall thickening of multiple loops of small bowel with ?associated mild mesenteric edema and interloop fluid is concerning ?for bowel ischemia, however no large arterial or venous filling ?defect is identified supplying this portion of bowel. ? ? ?I reevaluated patient and she is really not have any significant abdominal pain.  She reports some chronic right hip pain that she is been dealing with for a while now.  She denies any blood in her stools.  I really did not feel like this with represented mesenteric ischemia but given the abnormal CT I will add on lactate and consult surgery.  Dr. Claudine Mouton is currently in surgery.  We will hold off on heparin at this time.  Patient does report that she is already on Eliquis for A-fib but she has missed a few doses but she is currently in normal sinus.  This could put her at higher risk for ischemic colitis. ? ?Discussed the case with Dr. Claudine Mouton.  He reviewed the images and does not feel like it represents mesenteric ischemia.  Holding off on surgery but will admit to the hospital for trending lactate, abdominal exams and surgery will consult on and evaluate her.  Patient's abdomen is soft and nontender at this time and her and the daughter expressed understanding felt comfortable with this plan ? ? ? ?The patient is on the cardiac monitor to evaluate for evidence of arrhythmia and/or significant heart rate changes. ? ? ?FINAL CLINICAL IMPRESSION(S) / ED DIAGNOSES  ? ?Final diagnoses:  ?Colitis  ? ? ? ?Rx / DC Orders  ? ?ED Discharge Orders   ? ? None  ? ?  ? ? ? ?Note:  This document was prepared using Dragon voice recognition software and may include unintentional dictation  errors. ?  ?Concha Se, MD ?01/26/22 1512 ? ?

## 2022-01-26 NOTE — H&P (Addendum)
?History and Physical  ? ? ?Dawn Farmer LDJ:[id number] DOB: 1935/03/17 DOA: 01/26/2022 ? ?Referring MD/NP/PA:  ? ?PCP: Rayetta Humphrey, MD  ? ?Patient coming from:  The patient is coming from home.   ?    ? ?Chief Complaint: Nausea, vomiting, diarrhea, abdominal pain ? ?HPI: Dawn Farmer is a 87 y.o. female with medical history significant of hypertension, hyperlipidemia, diabetes mellitus, GERD, anxiety, atrial fibrillation on Eliquis, chronic back pain, neuropathy, who presents with nausea, vomiting, diarrhea and abdominal pain. ? ?Patient states that her symptoms have been going on for more than 2 days, including nausea, vomiting, diarrhea and abdominal pain.  She has approximately 4-5 times of nonbilious nonbloody vomiting, about 5 times of watery diarrhea each day.  Her abdominal pain is located in the epigastric area and right lower quadrant.  The abdominal pain is constant, burning-like, moderate, nonradiating.  She has chills, no fever.  No chest pain, cough, shortness breath.  No symptoms of UTI.  She states that she cannot keep anything down because of severe nausea vomiting. ? ?Data Reviewed and ED Course: pt was found to have WBC 6.4, lactic acid 1.7, troponin level 7, 7, negative COVID PCR, renal function okay, temperature 97.3, blood pressure 181/92, 133/79, heart rate 113, RR 20, oxygen saturation 96% on room air.  CT abdomen/pelvis showed possible ischemic bowel.  Patient is placed on telemetry bed for observation.  Dr. Claudine Mouton of general surgery is consulted. ? ? ?CT-abd/pelvis ?1. Mild bowel wall thickening of multiple loops of small bowel with ?associated mild mesenteric edema and interloop fluid is concerning ?for bowel ischemia, however no large arterial or venous filling ?defect is identified supplying this portion of bowel. ?  ?2. Atherosclerotic disease with multiple collaterals supplying the ?celiac artery and superior mesenteric artery distributions. ? ? ?EKG: I have personally  reviewed.  Sinus rhythm, QTc 447, low voltage, nonspecific T wave change ? ? ?Review of Systems:  ? ?General: no fevers, has chills, no body weight gain, has poor appetite, has fatigue ?HEENT: no blurry vision, hearing changes or sore throat ?Respiratory: no dyspnea, coughing, wheezing ?CV: no chest pain, no palpitations ?GI: has nausea, vomiting, abdominal pain, diarrhea, no constipation ?GU: no dysuria, burning on urination, increased urinary frequency, hematuria  ?Ext: no leg edema ?Neuro: no unilateral weakness, numbness, or tingling, no vision change or hearing loss ?Skin: no rash, no skin tear. ?MSK: No muscle spasm, no deformity, no limitation of range of movement in spin ?Heme: No easy bruising.  ?Travel history: No recent long distant travel. ? ? ?Allergy:  ?Allergies  ?Allergen Reactions  ? Metformin And Related   ? Reglan [Metoclopramide]   ? ? ?Past Medical History:  ?Diagnosis Date  ? Anxiety   ? Arthritis   ? Diabetes mellitus without complication (HCC)   ? GERD (gastroesophageal reflux disease)   ? Heart murmur   ? Hypertension   ? Peripheral neuropathy   ? ? ?Past Surgical History:  ?Procedure Laterality Date  ? ABDOMINAL HYSTERECTOMY    ? BALLOON DILATION N/A 04/23/2019  ? Procedure: BALLOON DILATION;  Surgeon: Scot Jun, MD;  Location: [facility] ENDOSCOPY;  Service: Endoscopy;  Laterality: N/A;  ? CARPAL TUNNEL RELEASE    ? CHOLECYSTECTOMY    ? COLONOSCOPY WITH ESOPHAGOGASTRODUODENOSCOPY (EGD)    ? ESOPHAGOGASTRODUODENOSCOPY (EGD) WITH PROPOFOL N/A 04/23/2019  ? Procedure: ESOPHAGOGASTRODUODENOSCOPY (EGD) WITH PROPOFOL;  Surgeon: Scot Jun, MD;  Location: [facility] ENDOSCOPY;  Service: Endoscopy;  Laterality: N/A;  ? rotator  cuff tear Right 2016  ? SHOULDER ARTHROSCOPY    ? SHOULDER ARTHROSCOPY Right 04/06/2015  ? Procedure: right shoulder arthroscopy, arthroscopic labral debridement, subacromial decompression, mini open rotator cuff repair;  Surgeon: Christena Flake, MD;  Location: [facility] ORS;   Service: Orthopedics;  Laterality: Right;  ? ? ?Social History:  reports that she has never smoked. She has never used smokeless tobacco. She reports that she does not drink alcohol and does not use drugs. ? ?Family History:  ?Family History  ?Problem Relation Age of Onset  ? Cancer Mother   ? Diabetes Mother   ? Cancer Father   ? Breast cancer Neg Hx   ?  ? ?Prior to Admission medications   ?Medication Sig Start Date End Date Taking? Authorizing Provider  ?apixaban (ELIQUIS) 5 MG TABS tablet Take 1 tablet (5 mg total) by mouth 2 (two) times daily. 04/20/20 10/26/21  Charise Killian, MD  ?aspirin 81 MG tablet Take 81 mg by mouth daily.    [provider]  ?azelastine (OPTIVAR) 0.05 % ophthalmic solution Place 1 drop into the left eye 2 (two) times daily. 10/26/21   Cuthriell, Delorise Royals, PA-C  ?baclofen (LIORESAL) 10 MG tablet Take 10 mg by mouth 2 (two) times daily as needed. 01/01/22   [provider]  ?cephALEXin (KEFLEX) 500 MG capsule Take 2 capsules (1,000 mg total) by mouth 2 (two) times daily. 10/06/20   Cuthriell, Delorise Royals, PA-C  ?diltiazem (CARDIZEM CD) 180 MG 24 hr capsule Take 1 capsule (180 mg total) by mouth daily. 04/21/20 10/26/21  Charise Killian, MD  ?gabapentin (NEURONTIN) 600 MG tablet Take 600 mg by mouth 2 (two) times daily.    [provider]  ?gabapentin (NEURONTIN) 800 MG tablet Take 800 mg by mouth 3 (three) times daily. 12/25/21   [provider]  ?glipiZIDE (GLUCOTROL XL) 5 MG 24 hr tablet Take 5 mg by mouth 2 (two) times daily. 01/20/20   [provider]  ?HYDROcodone-acetaminophen (NORCO/VICODIN) 5-325 MG tablet Take 1 tablet by mouth every 4 (four) hours as needed for moderate pain. 10/06/20   Cuthriell, Delorise Royals, PA-C  ?ketorolac (ACULAR) 0.5 % ophthalmic solution Place 1 drop into both eyes 4 (four) times daily. 10/26/21   Cuthriell, Delorise Royals, PA-C  ?losartan (COZAAR) 100 MG tablet Take 100 mg by mouth daily. 01/01/22   [provider]  ?losartan (COZAAR) 50 MG tablet Take 1 tablet (50 mg total) by mouth daily. 04/20/20 10/26/21  Charise Killian, MD  ?lovastatin (MEVACOR) 20 MG tablet Take 20 mg by mouth daily at 12 noon.    [provider]  ?metoprolol succinate (TOPROL-XL) 50 MG 24 hr tablet Take 1 tablet (50 mg total) by mouth daily. Take with or immediately following a meal. 04/21/20 10/26/21  Charise Killian, MD  ?Multiple Vitamins-Minerals (CENTRUM SILVER ULTRA WOMENS PO) Take 1 capsule by mouth daily at 12 noon.    [provider]  ?OMEGA-3 KRILL OIL PO Take 1 capsule by mouth daily at 12 noon.    [provider]  ?omeprazole (PRILOSEC) 40 MG capsule Take 40 mg by mouth 2 (two) times daily.    [provider]  ?predniSONE (DELTASONE) 20 MG tablet Take 20 mg by mouth 2 (two) times daily. 04/11/20   [provider]  ?traMADol (ULTRAM) 50 MG tablet Take 1 tablet (50 mg total) by mouth every 8 (eight) hours as needed for moderate pain or severe pain. 04/01/20   Adriana Simas,  Jayce G, DO  ? ? ?Physical Exam: ?Vitals:  ? 01/26/22 1130 01/26/22 1200 01/26/22 1300 01/26/22 1543  ?BP: (!) 181/92 (!) 143/61 133/79 (!) 158/72  ?Pulse: (!) 102 92 (!) 105 100  ?Resp: 19 15 17 20   ?Temp:    98 ?F (36.7 ?C)  ?TempSrc:    Oral  ?SpO2: 100% 96% 100% 97%  ?Weight:      ?Height:      ? ?General: Not in acute distress ?HEENT: ?      Eyes: PERRL, EOMI, no scleral icterus. ?      ENT: No discharge from the ears and nose, no pharynx injection, no tonsillar enlargement.  ?      Neck: No JVD, no bruit, no mass felt. ?Heme: No neck lymph node enlargement. ?Cardiac: S1/S2, RRR, No gallops or rubs. ?Respiratory: No rales, wheezing, rhonchi or rubs. ?GI: Soft, nondistended, has tenderness in gastric and right lower quadrant area, no rebound pain, no organomegaly, BS present. ?GU: No hematuria ?Ext: No pitting leg edema bilaterally. 1+DP/PT pulse bilaterally. ?Musculoskeletal: No joint deformities, No joint redness  or warmth, no limitation of ROM in spin. ?Skin: No rashes.  ?Neuro: Alert, oriented X3, cranial nerves II-XII grossly intact, moves all extremities normally. ?Psych: Patient is not psychotic, no suicidal or h

## 2022-01-26 NOTE — Consult Note (Signed)
[facility] ?SURGICAL CONSULTATION NOTE (initial) - cpt: [id number] ? ? ?HISTORY OF PRESENT ILLNESS (HPI):  ?87 y.o. female presented to [facility] ED today for evaluation of ongoing diarrhea and vomiting for over 2 days. Patient reports an inability to keep fluids down, and progressive weakness with associated dizziness.  She is a diabetic.  She reports she has been burning up, and cold all the same time.  No measured fevers taken.  She reports she is felt significantly better after bolused IV fluids in the ED.  She was having some significant pain with some lower back with hip radiation pain/arthritic pain that has exacerbated her nausea. ?A CT scan was obtained in the ER and some of the findings raise the question of possible ischemia. ? ?Surgery is consulted by ED physician Dr. Fuller Plan in this context for evaluation and management of nausea/vomiting, diarrhea and abdominal pain with CT changes. ? ?PAST MEDICAL HISTORY (PMH):  ?Past Medical History:  ?Diagnosis Date  ? Anxiety   ? Arthritis   ? Diabetes mellitus without complication (HCC)   ? GERD (gastroesophageal reflux disease)   ? Heart murmur   ? Hypertension   ? Peripheral neuropathy   ?  ? ?PAST SURGICAL HISTORY (PSH):  ?Past Surgical History:  ?Procedure Laterality Date  ? ABDOMINAL HYSTERECTOMY    ? BALLOON DILATION N/A 04/23/2019  ? Procedure: BALLOON DILATION;  Surgeon: Scot Jun, MD;  Location: [facility] ENDOSCOPY;  Service: Endoscopy;  Laterality: N/A;  ? CARPAL TUNNEL RELEASE    ? CHOLECYSTECTOMY    ? COLONOSCOPY WITH ESOPHAGOGASTRODUODENOSCOPY (EGD)    ? ESOPHAGOGASTRODUODENOSCOPY (EGD) WITH PROPOFOL N/A 04/23/2019  ? Procedure: ESOPHAGOGASTRODUODENOSCOPY (EGD) WITH PROPOFOL;  Surgeon: Scot Jun, MD;  Location: [facility] ENDOSCOPY;  Service: Endoscopy;  Laterality: N/A;  ? rotator cuff tear Right 2016  ? SHOULDER ARTHROSCOPY    ? SHOULDER ARTHROSCOPY Right 04/06/2015  ? Procedure: right shoulder arthroscopy, arthroscopic labral debridement,  subacromial decompression, mini open rotator cuff repair;  Surgeon: Christena Flake, MD;  Location: [facility] ORS;  Service: Orthopedics;  Laterality: Right;  ?  ? ?MEDICATIONS:  ?Prior to Admission medications   ?Medication Sig Start Date End Date Taking? Authorizing Provider  ?apixaban (ELIQUIS) 5 MG TABS tablet Take 1 tablet (5 mg total) by mouth 2 (two) times daily. 04/20/20 01/26/22 Yes Charise Killian, MD  ?baclofen (LIORESAL) 10 MG tablet Take 10 mg by mouth 2 (two) times daily as needed. 01/01/22  Yes [provider]  ?diltiazem (CARDIZEM CD) 180 MG 24 hr capsule Take 1 capsule (180 mg total) by mouth daily. 04/21/20 01/26/22 Yes Charise Killian, MD  ?aspirin 81 MG tablet Take 81 mg by mouth daily. ?Patient not taking: Reported on 01/26/2022    [provider]  ?azelastine (OPTIVAR) 0.05 % ophthalmic solution Place 1 drop into the left eye 2 (two) times daily. ?Patient not taking: Reported on 01/26/2022 10/26/21   Cuthriell, Delorise Royals, PA-C  ?cephALEXin (KEFLEX) 500 MG capsule Take 2 capsules (1,000 mg total) by mouth 2 (two) times daily. ?Patient not taking: Reported on 01/26/2022 10/06/20   Cuthriell, Delorise Royals, PA-C  ?gabapentin (NEURONTIN) 600 MG tablet Take 600 mg by mouth 2 (two) times daily.    [provider]  ?gabapentin (NEURONTIN) 800 MG tablet Take 800 mg by mouth 3 (three) times daily. ?Patient not taking: Reported on 01/26/2022 12/25/21   [provider]  ?glipiZIDE (GLUCOTROL XL) 5 MG 24 hr tablet Take 5 mg by mouth 2 (two)  times daily. 01/20/20   [provider]  ?HYDROcodone-acetaminophen (NORCO/VICODIN) 5-325 MG tablet Take 1 tablet by mouth every 4 (four) hours as needed for moderate pain. ?Patient not taking: Reported on 01/26/2022 10/06/20   Cuthriell, Delorise RoyalsJonathan D, PA-C  ?ketorolac (ACULAR) 0.5 % ophthalmic solution Place 1 drop into both eyes 4 (four) times daily. 10/26/21   Cuthriell, Delorise RoyalsJonathan D, PA-C  ?losartan (COZAAR) 100 MG tablet Take 100 mg by  mouth daily. 01/01/22   [provider]  ?losartan (COZAAR) 50 MG tablet Take 1 tablet (50 mg total) by mouth daily. 04/20/20 10/26/21  Charise KillianWilliams, Jamiese M, MD  ?lovastatin (MEVACOR) 20 MG tablet Take 20 mg by mouth daily at 12 noon.    [provider]  ?metoprolol succinate (TOPROL-XL) 50 MG 24 hr tablet Take 1 tablet (50 mg total) by mouth daily. Take with or immediately following a meal. 04/21/20 10/26/21  Charise KillianWilliams, Jamiese M, MD  ?Multiple Vitamins-Minerals (CENTRUM SILVER ULTRA WOMENS PO) Take 1 capsule by mouth daily at 12 noon.    [provider]  ?OMEGA-3 KRILL OIL PO Take 1 capsule by mouth daily at 12 noon.    [provider]  ?omeprazole (PRILOSEC) 40 MG capsule Take 40 mg by mouth 2 (two) times daily.    [provider]  ?predniSONE (DELTASONE) 20 MG tablet Take 20 mg by mouth 2 (two) times daily. ?Patient not taking: Reported on 01/26/2022 04/11/20   [provider]  ?traMADol (ULTRAM) 50 MG tablet Take 1 tablet (50 mg total) by mouth every 8 (eight) hours as needed for moderate pain or severe pain. 04/01/20   Tommie Samsook, Jayce G, DO  ?  ? ?ALLERGIES:  ?Allergies  ?Allergen Reactions  ? Metformin And Related   ? Reglan [Metoclopramide]   ?  ? ?SOCIAL HISTORY:  ?Social History  ? ?Socioeconomic History  ? Marital status: Widowed  ?  Spouse name: Not on file  ? Number of children: Not on file  ? Years of education: Not on file  ? Highest education level: Not on file  ?Occupational History  ? Not on file  ?Tobacco Use  ? Smoking status: Never  ? Smokeless tobacco: Never  ?Vaping Use  ? Vaping Use: Never used  ?Substance and Sexual Activity  ? Alcohol use: No  ? Drug use: No  ? Sexual activity: Not on file  ?Other Topics Concern  ? Not on file  ?Social History Narrative  ? Not on file  ? ?Social Determinants of Health  ? ?Financial Resource Strain: Not on file  ?Food Insecurity: Not on file  ?Transportation Needs: Not on file  ?Physical Activity: Not on file   ?Stress: Not on file  ?Social Connections: Not on file  ?Intimate Partner Violence: Not on file  ?  ? ?FAMILY HISTORY:  ?Family History  ?Problem Relation Age of Onset  ? Cancer Mother   ? Diabetes Mother   ? Cancer Father   ? Breast cancer Neg Hx   ?  ? ? ?REVIEW OF SYSTEMS:  ?Review of Systems  ?Constitutional:  Positive for chills and malaise/fatigue. Negative for fever.  ?HENT:  Negative for hearing loss and sore throat.   ?Eyes:  Negative for blurred vision.  ?Respiratory:  Negative for cough, shortness of breath and wheezing.   ?Cardiovascular:  Negative for chest pain, palpitations and leg swelling.  ?Gastrointestinal:  Positive for abdominal pain, diarrhea, nausea and vomiting. Negative for constipation.  ?Genitourinary:  Negative for dysuria, frequency and hematuria.  ?  Musculoskeletal:  Negative for myalgias.  ?Skin:  Negative for rash.  ?Neurological:  Negative for tingling and focal weakness.  ?Endo/Heme/Allergies:  Does not bruise/bleed easily.  ? ?VITAL SIGNS:  ?Temp:  [97.3 ?F (36.3 ?C)] 97.3 ?F (36.3 ?C) (03/25 1044) ?Pulse Rate:  [92-113] 105 (03/25 1300) ?Resp:  [15-20] 17 (03/25 1300) ?BP: (133-181)/(61-92) 133/79 (03/25 1300) ?SpO2:  [96 %-100 %] 100 % (03/25 1300) ?Weight:  [62.6 kg] 62.6 kg (03/25 1044)     Height: 5\' 3"  (160 cm) Weight: 62.6 kg BMI (Calculated): 24.45  ? ?INTAKE/OUTPUT:  ?No intake/output data recorded. ? ?PHYSICAL EXAM:  ?Physical Exam ?Blood pressure 133/79, pulse (!) 105, temperature (!) 97.3 ?F (36.3 ?C), resp. rate 17, height 5\' 3"  (1.6 m), weight 62.6 kg, SpO2 100 %. ?Last Weight  Most recent update: 01/26/2022 10:44 AM  ? ? Weight  ?62.6 kg (138 lb)  ?      ? ?  ? ? ?CONSTITUTIONAL: Well developed, and nourished, appropriately responsive and aware without distress.   ?EYES: Sclera non-icteric.   ?EARS, NOSE, MOUTH AND THROAT:  The oropharynx is clear.  Hearing is intact to voice.  ?NECK: Trachea is midline, and there is no jugular venous distension.  ?LYMPH NODES:  Lymph  nodes in the neck are not enlarged. ?RESPIRATORY:  Lungs are clear, and breath sounds are equal bilaterally. Normal respiratory effort without pathologic use of accessory muscles. ?CARDIOVASCULAR: Heart is regula

## 2022-01-27 DIAGNOSIS — Z90710 Acquired absence of both cervix and uterus: Secondary | ICD-10-CM | POA: Diagnosis not present

## 2022-01-27 DIAGNOSIS — Z79899 Other long term (current) drug therapy: Secondary | ICD-10-CM | POA: Diagnosis not present

## 2022-01-27 DIAGNOSIS — Z66 Do not resuscitate: Secondary | ICD-10-CM | POA: Diagnosis present

## 2022-01-27 DIAGNOSIS — R112 Nausea with vomiting, unspecified: Secondary | ICD-10-CM | POA: Diagnosis present

## 2022-01-27 DIAGNOSIS — Z833 Family history of diabetes mellitus: Secondary | ICD-10-CM | POA: Diagnosis not present

## 2022-01-27 DIAGNOSIS — Z7982 Long term (current) use of aspirin: Secondary | ICD-10-CM | POA: Diagnosis not present

## 2022-01-27 DIAGNOSIS — G8929 Other chronic pain: Secondary | ICD-10-CM | POA: Diagnosis present

## 2022-01-27 DIAGNOSIS — A0811 Acute gastroenteropathy due to Norwalk agent: Secondary | ICD-10-CM | POA: Diagnosis present

## 2022-01-27 DIAGNOSIS — B961 Klebsiella pneumoniae [K. pneumoniae] as the cause of diseases classified elsewhere: Secondary | ICD-10-CM | POA: Diagnosis present

## 2022-01-27 DIAGNOSIS — E785 Hyperlipidemia, unspecified: Secondary | ICD-10-CM | POA: Diagnosis present

## 2022-01-27 DIAGNOSIS — E860 Dehydration: Secondary | ICD-10-CM | POA: Diagnosis present

## 2022-01-27 DIAGNOSIS — K529 Noninfective gastroenteritis and colitis, unspecified: Secondary | ICD-10-CM | POA: Diagnosis not present

## 2022-01-27 DIAGNOSIS — R8271 Bacteriuria: Secondary | ICD-10-CM | POA: Diagnosis present

## 2022-01-27 DIAGNOSIS — E1140 Type 2 diabetes mellitus with diabetic neuropathy, unspecified: Secondary | ICD-10-CM | POA: Diagnosis present

## 2022-01-27 DIAGNOSIS — I482 Chronic atrial fibrillation, unspecified: Secondary | ICD-10-CM | POA: Diagnosis present

## 2022-01-27 DIAGNOSIS — Z9049 Acquired absence of other specified parts of digestive tract: Secondary | ICD-10-CM | POA: Diagnosis not present

## 2022-01-27 DIAGNOSIS — Z20822 Contact with and (suspected) exposure to covid-19: Secondary | ICD-10-CM | POA: Diagnosis present

## 2022-01-27 DIAGNOSIS — I10 Essential (primary) hypertension: Secondary | ICD-10-CM | POA: Diagnosis present

## 2022-01-27 DIAGNOSIS — R197 Diarrhea, unspecified: Secondary | ICD-10-CM | POA: Diagnosis not present

## 2022-01-27 DIAGNOSIS — Z7901 Long term (current) use of anticoagulants: Secondary | ICD-10-CM | POA: Diagnosis not present

## 2022-01-27 DIAGNOSIS — K219 Gastro-esophageal reflux disease without esophagitis: Secondary | ICD-10-CM | POA: Diagnosis present

## 2022-01-27 DIAGNOSIS — Z7984 Long term (current) use of oral hypoglycemic drugs: Secondary | ICD-10-CM | POA: Diagnosis not present

## 2022-01-27 LAB — GASTROINTESTINAL PANEL BY PCR, STOOL (REPLACES STOOL CULTURE)

## 2022-01-27 LAB — C DIFFICILE QUICK SCREEN W PCR REFLEX
C Diff antigen: POSITIVE — AB
C Diff toxin: NEGATIVE

## 2022-01-27 LAB — BASIC METABOLIC PANEL
Anion gap: 7 (ref 5–15)
BUN: 13 mg/dL (ref 8–23)
CO2: 24 mmol/L (ref 22–32)
Calcium: 8.2 mg/dL — ABNORMAL LOW (ref 8.9–10.3)
Chloride: 106 mmol/L (ref 98–111)
Creatinine, Ser: 0.73 mg/dL (ref 0.44–1.00)
GFR, Estimated: >60 mL/min (ref >60)
Glucose, Bld: 145 mg/dL — ABNORMAL HIGH (ref 70–99)
Potassium: 3.2 mmol/L — ABNORMAL LOW (ref 3.5–5.1)
Sodium: 137 mmol/L (ref 135–145)

## 2022-01-27 LAB — GLUCOSE, CAPILLARY
Glucose-Capillary: 145 mg/dL — ABNORMAL HIGH (ref 70–99)
Glucose-Capillary: 164 mg/dL — ABNORMAL HIGH (ref 70–99)
Glucose-Capillary: 146 mg/dL — ABNORMAL HIGH (ref 70–99)
Glucose-Capillary: 154 mg/dL — ABNORMAL HIGH (ref 70–99)

## 2022-01-27 LAB — CLOSTRIDIUM DIFFICILE BY PCR, REFLEXED: Toxigenic C. Difficile by PCR: NEGATIVE

## 2022-01-27 MED ORDER — POTASSIUM CHLORIDE CRYS ER 20 MEQ PO TBCR
40.0000 meq | EXTENDED_RELEASE_TABLET | Freq: Once | ORAL | Status: AC
  Administered 2022-01-27: 40 meq via ORAL
  Filled 2022-01-27: qty 2

## 2022-01-27 MED ORDER — SODIUM CHLORIDE 0.9 % IV SOLN
1.0000 g | Freq: Once | INTRAVENOUS | Status: AC
  Administered 2022-01-27: 1 g via INTRAVENOUS
  Filled 2022-01-27: qty 1

## 2022-01-27 MED ORDER — PROCHLORPERAZINE EDISYLATE 10 MG/2ML IJ SOLN
10.0000 mg | Freq: Once | INTRAMUSCULAR | Status: AC
  Administered 2022-01-27: 10 mg via INTRAVENOUS
  Filled 2022-01-27: qty 2

## 2022-01-27 MED ORDER — ONDANSETRON HCL 4 MG/2ML IJ SOLN
4.0000 mg | Freq: Four times a day (QID) | INTRAMUSCULAR | Status: DC | PRN
Start: 2022-01-27 — End: 2022-01-29
  Administered 2022-01-28 (×2): 4 mg via INTRAVENOUS
  Filled 2022-01-27 (×2): qty 2

## 2022-01-27 MED ORDER — HYDRALAZINE HCL 20 MG/ML IJ SOLN
10.0000 mg | INTRAMUSCULAR | Status: DC | PRN
  Administered 2022-01-27 – 2022-01-28 (×2): 10 mg via INTRAVENOUS
  Filled 2022-01-27 (×2): qty 1

## 2022-01-27 NOTE — Progress Notes (Signed)
?PROGRESS NOTE ? ? ? ?Dawn Farmer  DJS:[id number] DOB: July 11, 1935 DOA: 01/26/2022 ?PCP: Rayetta Humphrey, MD  ? ? ?Brief Narrative:  ?87 y.o. female with medical history significant of hypertension, hyperlipidemia, diabetes mellitus, GERD, anxiety, atrial fibrillation on Eliquis, chronic back pain, neuropathy, who presents with nausea, vomiting, diarrhea and abdominal pain. ?  ?Patient states that her symptoms have been going on for more than 2 days, including nausea, vomiting, diarrhea and abdominal pain.  She has approximately 4-5 times of nonbilious nonbloody vomiting, about 5 times of watery diarrhea each day.  Her abdominal pain is located in the epigastric area and right lower quadrant.  The abdominal pain is constant, burning-like, moderate, nonradiating.  She has chills, no fever.  No chest pain, cough, shortness breath.    She states that she cannot keep anything down because of severe nausea vomiting. ? ? ?Assessment & Plan: ?  ?Principal Problem: ?  Nausea vomiting and diarrhea ?Active Problems: ?  Abdominal pain ?  Diabetes mellitus without complication (HCC) ?  Hypertension ?  Atrial fibrillation, chronic (HCC) ?  HLD (hyperlipidemia) ? ?Intractable nausea and vomiting ?Diarrhea ?Abdominal pain ?CT with bowel wall thickening with associated mesenteric edema ?Radiographic concern for bowel ischemia ?Patient with benign abdomen.  Negative lactic acid.  No white count ?Clinical presentation is inconsistent with ischemic gut ?Highest on differential would be viral gastroenteritis ?General surgery consulted, no plans for surgical intervention ?Plan: ?Continue conservative management ?Clear liquid diet ?IV fluid resuscitation ?Check C. difficile PCR and GI pathogen panel ?If negative can start antidiarrheals ?As needed antiemetics ?Vitals per unit protocol ?Possible discharge in 24 hours if clinical exam is reassuring ? ?Diabetes mellitus type 2 without complication ?Recent hemoglobin A1c 7.8 ?Patient on  glipizide at home ?Plan: ?Hold glipizide ?Sliding scale ? ?Essential hypertension ?PTA Cardizem, Cozaar, metoprolol ?As needed IV hydralazine ? ?Chronic atrial fibrillation ?Resumed Eliquis ?Continue metoprolol Cardizem ? ?Hyperlipidemia ?Statin ? ?GERD ?Protonix ? ? ?DVT prophylaxis: Eliquis ?Code Status: DNR ?Family Communication: None today ?Disposition Plan: Status is: Observation ?The patient will require care spanning > 2 midnights and should be moved to inpatient because: Intractable nausea and vomiting associated with diarrhea.  Concern for infectious gastroenteritis.  Outside concern for ischemic bowel.  Possible discharge in 24 hours ? ? ?Level of care: Telemetry Medical ? ?Consultants:  ?General surgery ? ?Procedures:  ?None ? ?Antimicrobials: ?None ? ? ?Subjective: ?Patient seen and examined.  Resting comfortably in bed.  No visible distress.  No pain complaints.  Does endorse diarrhea.  Nausea improved. ? ?Objective: ?Vitals:  ? 01/26/22 1934 01/27/22 0003 01/27/22 0547 01/27/22 0735  ?BP: (!) 172/86 (!) 147/78 (!) 164/95 (!) 175/96  ?Pulse: (!) 102 (!) 110 (!) 108 (!) 109  ?Resp: 20 18 16 16   ?Temp: 97.8 ?F (36.6 ?C) 97.6 ?F (36.4 ?C) 98.1 ?F (36.7 ?C) 97.6 ?F (36.4 ?C)  ?TempSrc: Oral Oral Oral Oral  ?SpO2: 96% 98% 96% 95%  ?Weight:      ?Height:      ? ? ?Intake/Output Summary (Last 24 hours) at 01/27/2022 0945 ?Last data filed at 01/27/2022 01/29/2022 ?Gross per 24 hour  ?Intake 2558.83 ml  ?Output 400 ml  ?Net 2158.83 ml  ? ?Filed Weights  ? 01/26/22 1044  ?Weight: 62.6 kg  ? ? ?Examination: ? ?General exam: No acute distress ?Respiratory system: Clear to auscultation. Respiratory effort normal. ?Cardiovascular system: S1-S2, regular rate, irregular rhythm, no murmurs, no pedal edema ?Gastrointestinal system: Soft, nondistended, mild tender to  palpation R LQ, normal bowel sounds ?Central nervous system: Alert and oriented. No focal neurological deficits. ?Extremities: Symmetric 5 x 5 power. ?Skin: No  rashes, lesions or ulcers ?Psychiatry: Judgement and insight appear normal. Mood & affect appropriate.  ? ? ? ?Data Reviewed: I have personally reviewed following labs and imaging studies ? ?CBC: ?Recent Labs  ?Lab 01/26/22 ?1052  ?WBC 6.4  ?NEUTROABS 4.7  ?HGB 13.2  ?HCT 40.3  ?MCV 89.6  ?PLT 345  ? ?Basic Metabolic Panel: ?Recent Labs  ?Lab 01/26/22 ?1142 01/27/22 ?0424  ?NA 137 137  ?K 5.1 3.2*  ?CL 105 106  ?CO2 23 24  ?GLUCOSE 179* 145*  ?BUN 21 13  ?CREATININE 0.81 0.73  ?CALCIUM 8.8* 8.2*  ?MG 1.8  --   ? ?GFR: ?Estimated Creatinine Clearance: 43.3 mL/min (by C-G formula based on SCr of 0.73 mg/dL). ?Liver Function Tests: ?Recent Labs  ?Lab 01/26/22 ?1142  ?AST 37  ?ALT 21  ?ALKPHOS 64  ?BILITOT 1.1  ?PROT 7.1  ?ALBUMIN 3.3*  ? ?No results for input(s): LIPASE, AMYLASE in the last 168 hours. ?No results for input(s): AMMONIA in the last 168 hours. ?Coagulation Profile: ?No results for input(s): INR, PROTIME in the last 168 hours. ?Cardiac Enzymes: ?No results for input(s): CKTOTAL, CKMB, CKMBINDEX, TROPONINI in the last 168 hours. ?BNP (last 3 results) ?No results for input(s): PROBNP in the last 8760 hours. ?HbA1C: ?No results for input(s): HGBA1C in the last 72 hours. ?CBG: ?Recent Labs  ?Lab 01/26/22 ?1645 01/26/22 ?2054 01/27/22 ?0732  ?GLUCAP 132* 148* 145*  ? ?Lipid Profile: ?No results for input(s): CHOL, HDL, LDLCALC, TRIG, CHOLHDL, LDLDIRECT in the last 72 hours. ?Thyroid Function Tests: ?No results for input(s): TSH, T4TOTAL, FREET4, T3FREE, THYROIDAB in the last 72 hours. ?Anemia Panel: ?No results for input(s): VITAMINB12, FOLATE, FERRITIN, TIBC, IRON, RETICCTPCT in the last 72 hours. ?Sepsis Labs: ?Recent Labs  ?Lab 01/26/22 ?1328 01/26/22 ?1637  ?LATICACIDVEN 1.7 1.6  ? ? ?Recent Results (from the past 240 hour(s))  ?Resp Panel by RT-PCR (Flu A&B, Covid) Nasopharyngeal Swab     Status: None  ? Collection Time: 01/26/22 10:52 AM  ? Specimen: Nasopharyngeal Swab; Nasopharyngeal(NP) swabs in vial  transport medium  ?Result Value Ref Range Status  ? SARS Coronavirus 2 by RT PCR NEGATIVE NEGATIVE Final  ?  Comment: (NOTE) ?SARS-CoV-2 target nucleic acids are NOT DETECTED. ? ?The SARS-CoV-2 RNA is generally detectable in upper respiratory ?specimens during the acute phase of infection. The lowest ?concentration of SARS-CoV-2 viral copies this assay can detect is ?138 copies/mL. A negative result does not preclude SARS-Cov-2 ?infection and should not be used as the sole basis for treatment or ?other patient management decisions. A negative result may occur with  ?improper specimen collection/handling, submission of specimen other ?than nasopharyngeal swab, presence of viral mutation(s) within the ?areas targeted by this assay, and inadequate number of viral ?copies(<138 copies/mL). A negative result must be combined with ?clinical observations, patient history, and epidemiological ?information. The expected result is Negative. ? ?Fact Sheet for Patients:  ?BloggerCourse.com ? ?Fact Sheet for Healthcare Providers:  ?SeriousBroker.it ? ?This test is no t yet approved or cleared by the [place] FDA and  ?has been authorized for detection and/or diagnosis of SARS-CoV-2 by ?FDA under an Emergency Use Authorization (EUA). This EUA will remain  ?in effect (meaning this test can be used) for the duration of the ?COVID-19 declaration under Section 564(b)(1) of the Act, 21 ?U.S.C.section 360bbb-3(b)(1), unless the authorization is terminated  ?  or revoked sooner.  ? ? ?  ? Influenza A by PCR NEGATIVE NEGATIVE Final  ? Influenza B by PCR NEGATIVE NEGATIVE Final  ?  Comment: (NOTE) ?The Xpert Xpress SARS-CoV-2/FLU/RSV plus assay is intended as an aid ?in the diagnosis of influenza from Nasopharyngeal swab specimens and ?should not be used as a sole basis for treatment. Nasal washings and ?aspirates are unacceptable for Xpert Xpress SARS-CoV-2/FLU/RSV ?testing. ? ?Fact  Sheet for Patients: ?BloggerCourse.comhttps://www.fda.gov/media/152166/download ? ?Fact Sheet for Healthcare Providers: ?SeriousBroker.ithttps://www.fda.gov/media/152162/download ? ?This test is not yet approved or cleared by the [place]

## 2022-01-27 NOTE — Progress Notes (Signed)
[facility] ?SURGICAL PROGRESS NOTE (cpt [phone]) ? ?Hospital Day(s): 1.  ? ?Post op day(s):  .  ? ?Interval History: Patient seen and examined, no acute events or new complaints overnight. Patient reports continuing to have diarrhea, denies denies nausea or vomiting.  Appears to be tolerating clear liquids well.  Currently on enteric precautions isolation. ? ?Review of Systems:  ?Constitutional: denies fever, chills  ?HEENT: denies cough or congestion  ?Respiratory: denies any shortness of breath  ?Cardiovascular: denies chest pain or palpitations  ?Gastrointestinal: denies abdominal pain,  ?Genitourinary: denies burning with urination or urinary frequency ?Musculoskeletal: denies pain, decreased motor or sensation ?Integumentary: denies any other rashes or skin discolorations ?Neurological: denies HA or vision/hearing changes  ? ?Vital signs in last 24 hours: [min-max] current  ?Temp:  [97.3 ?F (36.3 ?C)-98.1 ?F (36.7 ?C)] 97.6 ?F (36.4 ?C) (03/26 0735) ?Pulse Rate:  [92-113] 109 (03/26 0735) ?Resp:  [15-20] 16 (03/26 0735) ?BP: (133-181)/(61-96) 175/96 (03/26 0735) ?SpO2:  [95 %-100 %] 95 % (03/26 0735) ?Weight:  [62.6 kg] 62.6 kg (03/25 1044)     Height: 5\' 3"  (160 cm) Weight: 62.6 kg BMI (Calculated): 24.45  ? ?Intake/Output last 2 shifts:  ?03/25 0701 - 03/26 0700 ?In: 2045.5 [I.V.:1045.5; IV Piggyback:1000] ?Out: 400 [Urine:400]  ? ?Physical Exam:  ?Constitutional: alert, cooperative and no distress  ?HENT: normocephalic without obvious abnormality  ?Eyes: PERRL, EOM's grossly intact and symmetric  ?Neuro: CN II - XII grossly intact and symmetric without deficit  ?Respiratory: breathing non-labored at rest  ?Cardiovascular: regular rate and sinus rhythm  ?Gastrointestinal: soft, non-tender, and non-distended ?Musculoskeletal: UE and LE FROM, no edema or wounds, motor and sensation grossly intact, NT  ? ? ?Labs:  ? ?  Latest Ref Rng & Units 01/26/2022  ? 10:52 AM 10/06/2020  ?  8:33 PM 04/20/2020   ?  4:56 AM  ?CBC  ?WBC 4.0 - 10.5 K/uL 6.4   7.5   7.7    ?Hemoglobin 12.0 - 15.0 g/dL 04/22/2020   76.1   60.7    ?Hematocrit 36.0 - 46.0 % 40.3   38.9   30.1    ?Platelets 150 - 400 K/uL 345   217   164    ? ? ?  Latest Ref Rng & Units 01/27/2022  ?  4:24 AM 01/26/2022  ? 11:42 AM 10/06/2020  ?  8:33 PM  ?CMP  ?Glucose 70 - 99 mg/dL 14/01/2020   062   694    ?BUN 8 - 23 mg/dL 13   21   22     ?Creatinine 0.44 - 1.00 mg/dL 854     6.27    ?Sodium 135 - 145 mmol/L 137   137   139    ?Potassium 3.5 - 5.1 mmol/L 3.2   5.1   4.5    ?Chloride 98 - 111 mmol/L 106   105   101    ?CO2 22 - 32 mmol/L 24   23   25     ?Calcium 8.9 - 10.3 mg/dL 8.2   8.8   9.4    ?Total Protein 6.5 - 8.1 g/dL  7.1     ?Total Bilirubin 0.3 - 1.2 mg/dL  1.1     ?Alkaline Phos 38 - 126 U/L  64     ?AST 15 - 41 U/L  37     ?ALT 0 - 44 U/L  21     ? ?Venous lactate was normal x2 yesterday. ? ?Imaging  studies: No new pertinent imaging studies ? ? ?Assessment/Plan:  ?87 y.o. female with dehydration from diarrhea and vomiting,  complicated by pertinent comorbidities including :. ?Patient Active Problem List  ? Diagnosis Date Noted  ? Nausea vomiting and diarrhea 01/26/2022  ? Atrial fibrillation, chronic (HCC) 01/26/2022  ? Abdominal pain 01/26/2022  ? HLD (hyperlipidemia) 01/26/2022  ? Atrial fibrillation (HCC) 04/19/2020  ? Hyponatremia 04/18/2020  ? Atrial fibrillation with RVR (HCC) 04/18/2020  ? Atrial fibrillation with rapid ventricular response (HCC) 04/18/2020  ? Diabetes mellitus without complication (HCC)   ? Hypertension   ? ? ? -No evidence of abdominal pain consistent with ischemic etiology. ? -Suspect gastroenteritis/potential other sources of diarrhea. ? -Having no surgical indications, will sign off. ? -Feel free to let us know if we can be any further help for this patient. ? ?All of the above findings and recommendations were discussed with the patient, and all of patient's questions were answered to her expressed satisfaction. ? ? ?-- ?Campbell Lerner M.D., FACS ?01/27/2022 8:45 AM  ?

## 2022-01-28 DIAGNOSIS — R112 Nausea with vomiting, unspecified: Secondary | ICD-10-CM | POA: Diagnosis not present

## 2022-01-28 DIAGNOSIS — R197 Diarrhea, unspecified: Secondary | ICD-10-CM | POA: Diagnosis not present

## 2022-01-28 LAB — GLUCOSE, CAPILLARY
Glucose-Capillary: 159 mg/dL — ABNORMAL HIGH (ref 70–99)
Glucose-Capillary: 146 mg/dL — ABNORMAL HIGH (ref 70–99)
Glucose-Capillary: 152 mg/dL — ABNORMAL HIGH (ref 70–99)
Glucose-Capillary: 183 mg/dL — ABNORMAL HIGH (ref 70–99)

## 2022-01-28 MED ORDER — FIDAXOMICIN 200 MG PO TABS
200.0000 mg | ORAL_TABLET | Freq: Two times a day (BID) | ORAL | Status: DC
  Filled 2022-01-28: qty 1

## 2022-01-28 MED ORDER — CHOLESTYRAMINE 4 G PO PACK
4.0000 g | PACK | Freq: Two times a day (BID) | ORAL | Status: DC
  Administered 2022-01-28 – 2022-01-29 (×3): 4 g via ORAL
  Filled 2022-01-28 (×3): qty 1

## 2022-01-28 NOTE — Progress Notes (Signed)
?PROGRESS NOTE ? ? ? ?Dawn Farmer  LGX:[id number] DOB: [birth year] DOA: 01/26/2022 ?PCP: Rayetta Humphrey, MD  ? ? ?Brief Narrative:  ?87 y.o. female with medical history significant of hypertension, hyperlipidemia, diabetes mellitus, GERD, anxiety, atrial fibrillation on Eliquis, chronic back pain, neuropathy, who presents with nausea, vomiting, diarrhea and abdominal pain. ?  ?Patient states that her symptoms have been going on for more than 2 days, including nausea, vomiting, diarrhea and abdominal pain.  She has approximately 4-5 times of nonbilious nonbloody vomiting, about 5 times of watery diarrhea each day.  Her abdominal pain is located in the epigastric area and right lower quadrant.  The abdominal pain is constant, burning-like, moderate, nonradiating.  She has chills, no fever.  No chest pain, cough, shortness breath.    She states that she cannot keep anything down because of severe nausea vomiting. ? ? ?Assessment & Plan: ?  ?Principal Problem: ?  Nausea vomiting and diarrhea ?Active Problems: ?  Abdominal pain ?  Diabetes mellitus without complication (HCC) ?  Hypertension ?  Atrial fibrillation, chronic (HCC) ?  HLD (hyperlipidemia) ?  Intractable nausea and vomiting ? ?Intractable nausea and vomiting ?Diarrhea secondary to norovirus infection ?Abdominal pain ?CT with bowel wall thickening with associated mesenteric edema ?Radiographic concern for bowel ischemia ?Patient with benign abdomen.  Negative lactic acid.  No white count ?Clinical presentation is inconsistent with ischemic gut ?Highest on differential would be viral gastroenteritis ?General surgery consulted, no plans for surgical intervention ?Nausea and vomiting improved ?Plan: ?Continue conservative management ?Advance diet as tolerated ?Maintenance IV fluids, NS at 75 cc/h ?Twice daily Questran ?As needed antiemetics ?Vitals per unit protocol ?Possible discharge in 24 hours if clinical exam is reassuring and diarrhea  improved ? ?Diabetes mellitus type 2 without complication ?Recent hemoglobin A1c 7.8 ?Patient on glipizide at home ?Plan: ?Hold glipizide ?Sliding scale ?Regular diet for now ? ?Essential hypertension ?PTA Cardizem, Cozaar, metoprolol ?As needed IV hydralazine ? ?Chronic atrial fibrillation ?Resumed Eliquis ?Continue metoprolol Cardizem ? ?Hyperlipidemia ?Statin ? ?GERD ?Protonix ? ? ?DVT prophylaxis: Eliquis ?Code Status: DNR ?Family Communication: None today ?Disposition Plan: Status is: Inpatient ?Remains inpatient appropriate because: Persistent diarrhea in setting of norovirus infection.  Anticipate discharge 3/28 ? ? ? ? ?Level of care: Telemetry Medical ? ?Consultants:  ?General surgery ? ?Procedures:  ?None ? ?Antimicrobials: ?None ? ? ?Subjective: ?Patient seen and examined.  Resting comfortably in bed.  No visible distress.  No pain complaints.  Persistent diarrhea.  Nausea improved ? ?Objective: ?Vitals:  ? 01/28/22 0127 01/28/22 0427 01/28/22 0755 01/28/22 0801  ?BP: 135/64 (!) 148/76 (!) 161/82 132/76  ?Pulse: (!) 101 97 95 75  ?Resp:  18 16 16   ?Temp:  98.3 ?F (36.8 ?C) 97.9 ?F (36.6 ?C) 97.9 ?F (36.6 ?C)  ?TempSrc:  Oral Oral Oral  ?SpO2:  95% 96% 100%  ?Weight:      ?Height:      ? ? ?Intake/Output Summary (Last 24 hours) at 01/28/2022 1252 ?Last data filed at 01/27/2022 1709 ?Gross per 24 hour  ?Intake 538.16 ml  ?Output 700 ml  ?Net -161.84 ml  ? ?Filed Weights  ? 01/26/22 1044  ?Weight: 62.6 kg  ? ? ?Examination: ? ?General exam: NAD ?Respiratory system: Clear to auscultation. Respiratory effort normal. ?Cardiovascular system: S1-S2, regular rate, irregular rhythm, no murmurs, no pedal edema ?Gastrointestinal system: Soft, NT/ND, normal bowel sounds ?Central nervous system: Alert and oriented. No focal neurological deficits. ?Extremities: Symmetric 5 x 5 power. ?Skin: No  rashes, lesions or ulcers ?Psychiatry: Judgement and insight appear normal. Mood & affect appropriate.  ? ? ? ?Data Reviewed: I  have personally reviewed following labs and imaging studies ? ?CBC: ?Recent Labs  ?Lab 01/26/22 ?1052  ?WBC 6.4  ?NEUTROABS 4.7  ?HGB 13.2  ?HCT 40.3  ?MCV 89.6  ?PLT 345  ? ?Basic Metabolic Panel: ?Recent Labs  ?Lab 01/26/22 ?1142 01/27/22 ?0424  ?NA 137 137  ?K 5.1 3.2*  ?CL 105 106  ?CO2 23 24  ?GLUCOSE 179* 145*  ?BUN 21 13  ?CREATININE 0.81 0.73  ?CALCIUM 8.8* 8.2*  ?MG 1.8  --   ? ?GFR: ?Estimated Creatinine Clearance: 43.3 mL/min (by C-G formula based on SCr of 0.73 mg/dL). ?Liver Function Tests: ?Recent Labs  ?Lab 01/26/22 ?1142  ?AST 37  ?ALT 21  ?ALKPHOS 64  ?BILITOT 1.1  ?PROT 7.1  ?ALBUMIN 3.3*  ? ?No results for input(s): LIPASE, AMYLASE in the last 168 hours. ?No results for input(s): AMMONIA in the last 168 hours. ?Coagulation Profile: ?No results for input(s): INR, PROTIME in the last 168 hours. ?Cardiac Enzymes: ?No results for input(s): CKTOTAL, CKMB, CKMBINDEX, TROPONINI in the last 168 hours. ?BNP (last 3 results) ?No results for input(s): PROBNP in the last 8760 hours. ?HbA1C: ?No results for input(s): HGBA1C in the last 72 hours. ?CBG: ?Recent Labs  ?Lab 01/27/22 ?1152 01/27/22 ?1644 01/27/22 ?2140 01/28/22 ?0756 01/28/22 ?1146  ?GLUCAP 164* 146* 154* 159* 146*  ? ?Lipid Profile: ?No results for input(s): CHOL, HDL, LDLCALC, TRIG, CHOLHDL, LDLDIRECT in the last 72 hours. ?Thyroid Function Tests: ?No results for input(s): TSH, T4TOTAL, FREET4, T3FREE, THYROIDAB in the last 72 hours. ?Anemia Panel: ?No results for input(s): VITAMINB12, FOLATE, FERRITIN, TIBC, IRON, RETICCTPCT in the last 72 hours. ?Sepsis Labs: ?Recent Labs  ?Lab 01/26/22 ?1328 01/26/22 ?1637  ?LATICACIDVEN 1.7 1.6  ? ? ?Recent Results (from the past 240 hour(s))  ?Gastrointestinal Panel by PCR , Stool     Status: Abnormal  ? Collection Time: 01/26/22 10:45 AM  ? Specimen: Stool  ?Result Value Ref Range Status  ? Campylobacter species NOT DETECTED NOT DETECTED Final  ? Plesimonas shigelloides NOT DETECTED NOT DETECTED Final  ?  Salmonella species NOT DETECTED NOT DETECTED Final  ? Yersinia enterocolitica NOT DETECTED NOT DETECTED Final  ? Vibrio species NOT DETECTED NOT DETECTED Final  ? Vibrio cholerae NOT DETECTED NOT DETECTED Final  ? Enteroaggregative E coli (EAEC) NOT DETECTED NOT DETECTED Final  ? Enteropathogenic E coli (EPEC) NOT DETECTED NOT DETECTED Final  ? Enterotoxigenic E coli (ETEC) NOT DETECTED NOT DETECTED Final  ? Shiga like toxin producing E coli (STEC) NOT DETECTED NOT DETECTED Final  ? Shigella/Enteroinvasive E coli (EIEC) NOT DETECTED NOT DETECTED Final  ? Cryptosporidium NOT DETECTED NOT DETECTED Final  ? Cyclospora cayetanensis NOT DETECTED NOT DETECTED Final  ? Entamoeba histolytica NOT DETECTED NOT DETECTED Final  ? Giardia lamblia NOT DETECTED NOT DETECTED Final  ? Adenovirus F40/41 NOT DETECTED NOT DETECTED Final  ? Astrovirus NOT DETECTED NOT DETECTED Final  ? Norovirus GI/GII DETECTED (A) NOT DETECTED Final  ?  Comment: CRITICAL RESULT CALLED TO, READ BACK BY AND VERIFIED WITH: ?JACKIE ATWATR @ 2005 01/27/22 LFD ?  ? Rotavirus A NOT DETECTED NOT DETECTED Final  ? Sapovirus (I, II, IV, and V) NOT DETECTED NOT DETECTED Final  ?  Comment: Performed at [facility], [address] [place], [place] [address]  ?C Difficile Quick Screen w PCR reflex  Status: Abnormal  ? Collection Time: 01/26/22 10:45 AM  ? Specimen: Stool  ?Result Value Ref Range Status  ? C Diff antigen POSITIVE (A) NEGATIVE Final  ? C Diff toxin NEGATIVE NEGATIVE Final  ? C Diff interpretation Results are indeterminate. See PCR results.  Final  ?  Comment: Performed at [facility], [address] [place], [place] [address]  ?C. Diff by PCR, Reflexed     Status: None  ? Collection Time: 01/26/22 10:45 AM  ?Result Value Ref Range Status  ? Toxigenic C. Difficile by PCR NEGATIVE NEGATIVE Final  ?  Comment: Patient is colonized with non toxigenic C. difficile. May not need treatment unless significant symptoms are  present. ?Performed at [facility], [address] [place], [place] [address] ?  ?Resp Panel by RT-PCR (Flu A&B, Covid) Nasopharyngeal Swab     Status: None  ? Collection Time: 01/26/22 10:52 AM  ? Specimen: Nasopharynge

## 2022-01-29 DIAGNOSIS — R197 Diarrhea, unspecified: Secondary | ICD-10-CM | POA: Diagnosis not present

## 2022-01-29 DIAGNOSIS — R112 Nausea with vomiting, unspecified: Secondary | ICD-10-CM | POA: Diagnosis not present

## 2022-01-29 LAB — URINE CULTURE: Culture: >=100000 — AB

## 2022-01-29 LAB — GLUCOSE, CAPILLARY
Glucose-Capillary: 132 mg/dL — ABNORMAL HIGH (ref 70–99)
Glucose-Capillary: 160 mg/dL — ABNORMAL HIGH (ref 70–99)

## 2022-01-29 MED ORDER — CHOLESTYRAMINE 4 G PO PACK: 4.0000 g | PACK | Freq: Two times a day (BID) | ORAL | 0 refills | Status: DC

## 2022-01-29 MED ORDER — ONDANSETRON HCL 4 MG PO TABS: 4.0000 mg | ORAL_TABLET | Freq: Every day | ORAL | 0 refills | Status: AC | PRN

## 2022-01-29 NOTE — Clinical Social Work Note (Signed)
?  Transition of Care (TOC) Screening Note ? ? ?Patient Details  ?Name: Dawn Farmer ?Date of Birth: [birth year] ? ? ?Transition of Care (TOC) CM/SW Contact:    ?Mykalah Saari A Theron Cumbie, LCSW ?Phone [phone] ?01/29/2022, 2:43 PM ? ? ? ?Transition of Care Department [facility]) has reviewed patient and no TOC needs have been identified at this time. We will continue to monitor patient advancement through interdisciplinary progression rounds. If new patient transition needs arise, please place a TOC consult. ?  ?

## 2022-01-29 NOTE — Evaluation (Signed)
Physical Therapy Evaluation ?Patient Details ?Name: Dawn Farmer ?MRN: [id number] ?DOB: August 08, 1935 ?Today's Date: 01/29/2022 ? ?History of Present Illness ? Pt is a 87 y.o. female who presents to the ED w/ complaints of epigastric abdominal pain for the past 2-3 days w/ nausea, vomiting, diarrhea. Currently admitted for the above problems and CT revealing bowel wall thickening w/ associated mesenteric edema. PmHx: DM, Anxiety, HTN, GERD, Peripheral Neuropathy. ?  ?Clinical Impression ? Pt awake and alert resting in bed upon PT entrance into room for evaluation today. Pt is A&Ox4 and denies any c/o pain at rest. Pt reports prior to hospitalization she lives alone and is independent w/ all ADLs. ? ?Pt is able to perform all bed mobility w/ modI and is able to progress to ambulating ~64ft. She has no c/o negative symptoms throughout the session and returns to the recliner w/ all needs w/in reach. Pt states she is at her baseline and does not need any Acute PT needs or follow-up PT at discharge. PT to SIGN OFF. ?   ? ?Recommendations for follow up therapy are one component of a multi-disciplinary discharge planning process, led by the attending physician.  Recommendations may be updated based on patient status, additional functional criteria and insurance authorization. ? ?Follow Up Recommendations No PT follow up ? ?  ?Assistance Recommended at Discharge PRN  ?Patient can return home with the following ?   ? ?  ?Equipment Recommendations None recommended by PT  ?Recommendations for Other Services ?    ?  ?Functional Status Assessment Patient has had a recent decline in their functional status and demonstrates the ability to make significant improvements in function in a reasonable and predictable amount of time.  ? ?  ?Precautions / Restrictions Precautions ?Precautions: None ?Restrictions ?Weight Bearing Restrictions: No  ? ?  ? ?Mobility ? Bed Mobility ?Overal bed mobility: Modified Independent ?  ?  ?  ?  ?  ?  ?   ?  ? ?Transfers ?Overall transfer level: Modified independent ?  ?  ?  ?  ?  ?  ?  ?  ?  ?  ? ?Ambulation/Gait ?Ambulation/Gait assistance: Modified independent (Device/Increase time) ?  ?  ?Gait Pattern/deviations: Step-through pattern, Decreased step length - right, Decreased step length - left, Decreased stride length ?  ?  ?  ?  ? ?Stairs ?  ?  ?  ?  ?  ? ?Wheelchair Mobility ?  ? ?Modified Rankin (Stroke Patients Only) ?  ? ?  ? ?Balance Overall balance assessment: Needs assistance ?Sitting-balance support: Feet supported, No upper extremity supported ?Sitting balance-Leahy Scale: Normal ?  ?  ?Standing balance support: No upper extremity supported ?Standing balance-Leahy Scale: Good ?  ?  ?  ?  ?  ?  ?  ?  ?  ?  ?  ?  ?   ? ? ? ?Pertinent Vitals/Pain Pain Assessment ?Pain Assessment: No/denies pain  ? ? ?Home Living Family/patient expects to be discharged to:: Private residence ?Living Arrangements: Alone ?Available Help at Discharge: Family;Friend(s);Available PRN/intermittently ?Type of Home: House ?Home Access: Stairs to enter ?Entrance Stairs-Rails: Can reach both ?Entrance Stairs-Number of Steps: 4 ?  ?Home Layout: One level ?Home Equipment: Gilmer Mor - single point ?   ?  ?Prior Function Prior Level of Function : Independent/Modified Independent ?  ?  ?  ?  ?  ?  ?  ?  ?  ? ? ?Hand Dominance  ?   ? ?  ?Extremity/Trunk  Assessment  ? Upper Extremity Assessment ?Upper Extremity Assessment: Overall WFL for tasks assessed;Generalized weakness ?  ? ?Lower Extremity Assessment ?Lower Extremity Assessment: Overall WFL for tasks assessed;Generalized weakness ?  ? ?   ?Communication  ?    ?Cognition Arousal/Alertness: Awake/alert ?Behavior During Therapy: [facility] for tasks assessed/performed ?Overall Cognitive Status: Within Functional Limits for tasks assessed ?  ?  ?  ?  ?  ?  ?  ?  ?  ?  ?  ?  ?  ?  ?  ?  ?  ?  ?  ? ?  ?General Comments   ? ?  ?Exercises    ? ?Assessment/Plan  ?  ?PT Assessment Patient does not need  any further PT services  ?PT Problem List   ? ?   ?  ?PT Treatment Interventions     ? ?PT Goals (Current goals can be found in the Care Plan section)  ?Acute Rehab PT Goals ?Patient Stated Goal: to go home ?PT Goal Formulation: With patient ?Time For Goal Achievement: 02/12/22 ?Potential to Achieve Goals: Good ? ?  ?Frequency   ?  ? ? ?Co-evaluation   ?  ?  ?  ?  ? ? ?  ?AM-PAC PT "6 Clicks" Mobility  ?Outcome Measure Help needed turning from your back to your side while in a flat bed without using bedrails?: None ?Help needed moving from lying on your back to sitting on the side of a flat bed without using bedrails?: None ?Help needed moving to and from a bed to a chair (including a wheelchair)?: None ?Help needed standing up from a chair using your arms (e.g., wheelchair or bedside chair)?: None ?Help needed to walk in hospital room?: None ?Help needed climbing 3-5 steps with a railing? : A Little ?6 Click Score: 23 ? ?  ?End of Session   ?Activity Tolerance: Patient tolerated treatment well ?Patient left: in chair;with chair alarm set;with call bell/phone within reach ?Nurse Communication: Mobility status ?PT Visit Diagnosis: Muscle weakness (generalized) (M62.81) ?  ? ?Time: 1350-1400 ?PT Time Calculation (min) (ACUTE ONLY): 10 min ? ? ?Charges:     ?  ?  ?   ? ?Jeralyn Bennett, SPT ?01/29/2022, 3:12 PM ? ?

## 2022-01-29 NOTE — Progress Notes (Signed)
Patient being discharged home. IV removed. Went over discharge instructions with patient and discharge medications. Patient stated that she understood and all questions answered. Patient going home POV with family. ?

## 2022-01-29 NOTE — Discharge Summary (Signed)
Physician Discharge Summary  ?Dawn Farmer BSJ:[id number] DOB: 30-Oct-1935 DOA: 01/26/2022 ? ?PCP: Rayetta Humphrey, MD ? ?Admit date: 01/26/2022 ?Discharge date: 01/29/2022 ? ?Admitted From: Home ?Disposition:  Home ? ?Recommendations for Outpatient Follow-up:  ?Follow up with PCP in 1-2 weeks ? ? ?Home Health:No ?Equipment/Devices:None  ? ?Discharge Condition:Stable  ?CODE STATUS:DNR  ?Diet recommendation: Regular ? ?Brief/Interim Summary: ?87 y.o. female with medical history significant of hypertension, hyperlipidemia, diabetes mellitus, GERD, anxiety, atrial fibrillation on Eliquis, chronic back pain, neuropathy, who presents with nausea, vomiting, diarrhea and abdominal pain. ?  ?Patient states that her symptoms have been going on for more than 2 days, including nausea, vomiting, diarrhea and abdominal pain.  She has approximately 4-5 times of nonbilious nonbloody vomiting, about 5 times of watery diarrhea each day.  Her abdominal pain is located in the epigastric area and right lower quadrant.  The abdominal pain is constant, burning-like, moderate, nonradiating.  She has chills, no fever.  No chest pain, cough, shortness breath.    She states that she cannot keep anything down because of severe nausea vomiting. ? ?Symptoms improved at time of dc.  Diarrhea resolved.  Stable for dc home ? ? ? ?Discharge Diagnoses:  ?Principal Problem: ?  Nausea vomiting and diarrhea ?Active Problems: ?  Abdominal pain ?  Diabetes mellitus without complication (HCC) ?  Hypertension ?  Atrial fibrillation, chronic (HCC) ?  HLD (hyperlipidemia) ?  Intractable nausea and vomiting ? ?Intractable nausea and vomiting ?Diarrhea secondary to norovirus infection ?Abdominal pain ?CT with bowel wall thickening with associated mesenteric edema ?Radiographic concern for bowel ischemia ?Patient with benign abdomen.  Negative lactic acid.  No white count ?Clinical presentation is inconsistent with ischemic gut ?Highest on differential would be  viral gastroenteritis ?General surgery consulted, no plans for surgical intervention ?Nausea and vomiting improved ?Plan: ?DC home.  Script provided for BID questran and prn zofran ?  ?Diabetes mellitus type 2 without complication ?Recent hemoglobin A1c 7.8 ?Patient on glipizide at home ?Plan: ?Can resume home regimen on dc ?  ?Essential hypertension ?PTA Cardizem, Cozaar, metoprolol ? ?  ?Chronic atrial fibrillation ?Resumed Eliquis ?Continue metoprolol Cardizem ?  ?Hyperlipidemia ?Statin ?  ?GERD ?Protonix ? ?Discharge Instructions ? ?Discharge Instructions   ? ? Diet - low sodium heart healthy   Complete by: As directed ?  ? Increase activity slowly   Complete by: As directed ?  ? ?  ? ?Allergies as of 01/29/2022   ? ?   Reactions  ? Metformin And Related   ? Reglan [metoclopramide]   ? ?  ? ?  ?Medication List  ?  ? ?STOP taking these medications   ? ?aspirin 81 MG tablet ?  ?azelastine 0.05 % ophthalmic solution ?Commonly known as: OPTIVAR ?  ?cephALEXin 500 MG capsule ?Commonly known as: KEFLEX ?  ?HYDROcodone-acetaminophen 5-325 MG tablet ?Commonly known as: NORCO/VICODIN ?  ?predniSONE 20 MG tablet ?Commonly known as: DELTASONE ?  ? ?  ? ?TAKE these medications   ? ?apixaban 5 MG Tabs tablet ?Commonly known as: ELIQUIS ?Take 1 tablet (5 mg total) by mouth 2 (two) times daily. ?  ?baclofen 10 MG tablet ?Commonly known as: LIORESAL ?Take 10 mg by mouth 2 (two) times daily as needed. ?  ?CENTRUM SILVER ULTRA WOMENS PO ?Take 1 capsule by mouth daily at 12 noon. ?  ?cholestyramine 4 g packet ?Commonly known as: QUESTRAN ?Take 1 packet (4 g total) by mouth 2 (two) times daily for 10 days. ?  ?  diltiazem 180 MG 24 hr capsule ?Commonly known as: CARDIZEM CD ?Take 1 capsule (180 mg total) by mouth daily. ?  ?gabapentin 600 MG tablet ?Commonly known as: NEURONTIN ?Take 600 mg by mouth 2 (two) times daily. ?What changed: Another medication with the same name was removed. Continue taking this medication, and follow the  directions you see here. ?  ?glipiZIDE 5 MG 24 hr tablet ?Commonly known as: GLUCOTROL XL ?Take 5 mg by mouth 2 (two) times daily. ?  ?ketorolac 0.5 % ophthalmic solution ?Commonly known as: Acular ?Place 1 drop into both eyes 4 (four) times daily. ?  ?losartan 50 MG tablet ?Commonly known as: COZAAR ?Take 1 tablet (50 mg total) by mouth daily. ?  ?losartan 100 MG tablet ?Commonly known as: COZAAR ?Take 100 mg by mouth daily. ?  ?lovastatin 20 MG tablet ?Commonly known as: MEVACOR ?Take 20 mg by mouth daily at 12 noon. ?  ?metoprolol succinate 50 MG 24 hr tablet ?Commonly known as: TOPROL-XL ?Take 1 tablet (50 mg total) by mouth daily. Take with or immediately following a meal. ?  ?OMEGA-3 KRILL OIL PO ?Take 1 capsule by mouth daily at 12 noon. ?  ?omeprazole 40 MG capsule ?Commonly known as: PRILOSEC ?Take 40 mg by mouth 2 (two) times daily. ?  ?ondansetron 4 MG tablet ?Commonly known as: Zofran ?Take 1 tablet (4 mg total) by mouth daily as needed for nausea or vomiting. ?  ?traMADol 50 MG tablet ?Commonly known as: ULTRAM ?Take 1 tablet (50 mg total) by mouth every 8 (eight) hours as needed for moderate pain or severe pain. ?  ? ?  ? ? ?Allergies  ?Allergen Reactions  ? Metformin And Related   ? Reglan [Metoclopramide]   ? ? ?Consultations: ?Gen surgery ? ? ?Procedures/Studies: ?DG Lumbar Spine 2-3 Views ? ?Result Date: 01/02/2022 ?CLINICAL DATA:  Right-sided low back pain, lumbar radiculopathy. EXAM: LUMBAR SPINE - 2-3 VIEW COMPARISON:  X-ray 04/01/2020; MRI 05/13/2020. FINDINGS: Normal lumbar lordosis. No acute fracture or listhesis of the lumbar spine. Vertebral body height is preserved. There is intervertebral disc space narrowing and endplate remodeling throughout the lumbar spine in keeping with changes of moderate to severe degenerative disc disease, most severe at L1-L3. This appears stable since prior examination the paraspinal soft tissues are unremarkable. IMPRESSION: Diffuse advanced degenerative disc  disease, stable since 04/01/2020. No acute fracture or listhesis. Electronically Signed   By: Helyn Numbers M.D.   On: 01/02/2022 23:12  ? ?CT ABDOMEN PELVIS W CONTRAST ? ?Result Date: 01/26/2022 ?CLINICAL DATA:  Abdominal pain, nausea, vomiting, diarrhea for 2 days. EXAM: CT ABDOMEN AND PELVIS WITH CONTRAST TECHNIQUE: Multidetector CT imaging of the abdomen and pelvis was performed using the standard protocol following bolus administration of intravenous contrast. RADIATION DOSE REDUCTION: This exam was performed according to the departmental dose-optimization program which includes automated exposure control, adjustment of the mA and/or kV according to patient size and/or use of iterative reconstruction technique. CONTRAST:  OMNIPAQUE IOHEXOL 300 MG/ML  SOLN COMPARISON:  None. FINDINGS: Lower chest: Mild scarring/atelectasis of the left lower lobe. There is a moderate sized hiatal hernia. Hepatobiliary: No focal liver abnormality is seen. Status post cholecystectomy. No biliary dilatation. Pancreas: Unremarkable. No pancreatic ductal dilatation or surrounding inflammatory changes. Spleen: Normal in size without focal abnormality. Adrenals/Urinary Tract: Adrenal glands are unremarkable. Kidneys are normal, without renal calculi, focal lesion, or hydronephrosis. Bladder is unremarkable. Stomach/Bowel: Stomach is within normal limits. A duodenal diverticulum is noted arising from the  third portion. No pericecal inflammatory changes are noted to suggest acute appendicitis. There is mild bowel wall thickening of multiple loops of small bowel with mild mesenteric edema and interloop fluid (series 6 image [id number]). There is no large arterial or venous filling defect supplying this portion of bowel. There is no evidence of bowel obstruction. Vascular/Lymphatic: There is moderate atherosclerotic disease of the thoracic and abdominal aorta is as well as the major branches of the abdominal aorta. There is an ulcerated  atherosclerotic plaque in the infrarenal abdominal aorta (series 5, image 37). The celiac axis and superior mesenteric artery are diminutive or partially occluded and significant collaterals are noted in t

## 2022-04-08 ENCOUNTER — Ambulatory Visit
Admission: EM | Admit: 2022-04-08 | Discharge: 2022-04-08 | Disposition: A | Discharge status: Home / Self Care | Payer: Medicare Other | Attending: Physician Assistant | Admitting: Physician Assistant

## 2022-04-08 ENCOUNTER — Other Ambulatory Visit: Payer: Self-pay

## 2022-04-08 DIAGNOSIS — S90861A Insect bite (nonvenomous), right foot, initial encounter: Secondary | ICD-10-CM | POA: Diagnosis not present

## 2022-04-08 DIAGNOSIS — W57XXXA Bitten or stung by nonvenomous insect and other nonvenomous arthropods, initial encounter: Secondary | ICD-10-CM | POA: Diagnosis not present

## 2022-04-08 DIAGNOSIS — M7989 Other specified soft tissue disorders: Secondary | ICD-10-CM | POA: Diagnosis not present

## 2022-04-08 MED ORDER — PREDNISONE 20 MG PO TABS: 40.0000 mg | ORAL_TABLET | Freq: Every day | ORAL | 0 refills | Status: AC

## 2022-04-08 NOTE — Discharge Instructions (Signed)
-  I have sent prednisone to pharmacy to help with swelling/inflammation. Keep an eye on your blood pressure and blood sugar while taking as this medication can cause elevations.  -Continue to ice and elevate the foot -Topical Benadryl or hydrocortisone for itching -Return if fever, worsening swelling or pain -This should be improving over the next couple of days

## 2022-04-08 NOTE — ED Provider Notes (Signed)
[facility]    CSN: [id number] Arrival date & time: 04/08/22  0801      History   Chief Complaint Chief Complaint  Patient presents with   Insect Bite    HPI Dawn Farmer is a 87 y.o. female presenting for 2-day history of right foot pain, swelling and redness.  Patient states she was stung by yellow jacket on top of her foot and has had swelling and pain ever since.  She states she has been icing and elevating as well as applying topical hydrocortisone without any improvement or worsening of her symptoms.  She states that it is very painful as well.  No fever or pustular drainage from the area.  Increased pain when she walks.  No other areas of rash or swelling.  Breathing normally.  No other complaints.  HPI  Past Medical History:  Diagnosis Date   Anxiety    Arthritis    Diabetes mellitus without complication (HCC)    GERD (gastroesophageal reflux disease)    Heart murmur    Hypertension    Peripheral neuropathy     Patient Active Problem List   Diagnosis Date Noted   Intractable nausea and vomiting 01/27/2022   Nausea vomiting and diarrhea 01/26/2022   Atrial fibrillation, chronic (HCC) 01/26/2022   Abdominal pain 01/26/2022   HLD (hyperlipidemia) 01/26/2022   Atrial fibrillation (HCC) 04/19/2020   Hyponatremia 04/18/2020   Atrial fibrillation with RVR (HCC) 04/18/2020   Atrial fibrillation with rapid ventricular response (HCC) 04/18/2020   Diabetes mellitus without complication (HCC)    Hypertension     Past Surgical History:  Procedure Laterality Date   ABDOMINAL HYSTERECTOMY     BALLOON DILATION N/A 04/23/2019   Procedure: BALLOON DILATION;  Surgeon: Scot Jun, MD;  Location: [facility] ENDOSCOPY;  Service: Endoscopy;  Laterality: N/A;   CARPAL TUNNEL RELEASE     CHOLECYSTECTOMY     COLONOSCOPY WITH ESOPHAGOGASTRODUODENOSCOPY (EGD)     ESOPHAGOGASTRODUODENOSCOPY (EGD) WITH PROPOFOL N/A 04/23/2019   Procedure: ESOPHAGOGASTRODUODENOSCOPY  (EGD) WITH PROPOFOL;  Surgeon: Scot Jun, MD;  Location: [facility] ENDOSCOPY;  Service: Endoscopy;  Laterality: N/A;   rotator cuff tear Right 2016   SHOULDER ARTHROSCOPY     SHOULDER ARTHROSCOPY Right 04/06/2015   Procedure: right shoulder arthroscopy, arthroscopic labral debridement, subacromial decompression, mini open rotator cuff repair;  Surgeon: Christena Flake, MD;  Location: [facility] ORS;  Service: Orthopedics;  Laterality: Right;    OB History   No obstetric history on file.      Home Medications    Prior to Admission medications   Medication Sig Start Date End Date Taking? Authorizing Provider  predniSONE (DELTASONE) 20 MG tablet Take 2 tablets (40 mg total) by mouth daily for 5 days. 04/08/22 04/13/22 Yes Shirlee Latch, PA-C  apixaban (ELIQUIS) 5 MG TABS tablet Take 1 tablet (5 mg total) by mouth 2 (two) times daily. 04/20/20 01/26/22  Charise Killian, MD  baclofen (LIORESAL) 10 MG tablet Take 10 mg by mouth 2 (two) times daily as needed. 01/01/22   [provider]  cholestyramine (QUESTRAN) 4 g packet Take 1 packet (4 g total) by mouth 2 (two) times daily for 10 days. 01/29/22 02/08/22  Tresa Moore, MD  diltiazem (CARDIZEM CD) 180 MG 24 hr capsule Take 1 capsule (180 mg total) by mouth daily. 04/21/20 01/26/22  Charise Killian, MD  gabapentin (NEURONTIN) 600 MG tablet Take 600 mg by mouth 2 (two) times daily.  [provider]  glipiZIDE (GLUCOTROL XL) 5 MG 24 hr tablet Take 5 mg by mouth 2 (two) times daily. 01/20/20   [provider]  ketorolac (ACULAR) 0.5 % ophthalmic solution Place 1 drop into both eyes 4 (four) times daily. 10/26/21   Cuthriell, Delorise RoyalsJonathan D, PA-C  losartan (COZAAR) 100 MG tablet Take 100 mg by mouth daily. 01/01/22   [provider]  losartan (COZAAR) 50 MG tablet Take 1 tablet (50 mg total) by mouth daily. 04/20/20 10/26/21  Charise KillianWilliams, Jamiese M, MD  lovastatin (MEVACOR) 20 MG tablet Take 20 mg by mouth daily at 12 noon.     [provider]  metoprolol succinate (TOPROL-XL) 50 MG 24 hr tablet Take 1 tablet (50 mg total) by mouth daily. Take with or immediately following a meal. 04/21/20 10/26/21  Charise KillianWilliams, Jamiese M, MD  Multiple Vitamins-Minerals (CENTRUM SILVER ULTRA WOMENS PO) Take 1 capsule by mouth daily at 12 noon.    [provider]  OMEGA-3 KRILL OIL PO Take 1 capsule by mouth daily at 12 noon.    [provider]  omeprazole (PRILOSEC) 40 MG capsule Take 40 mg by mouth 2 (two) times daily.    [provider]  ondansetron (ZOFRAN) 4 MG tablet Take 1 tablet (4 mg total) by mouth daily as needed for nausea or vomiting. 01/29/22 01/29/23  Tresa MooreSreenath, Sudheer B, MD  traMADol (ULTRAM) 50 MG tablet Take 1 tablet (50 mg total) by mouth every 8 (eight) hours as needed for moderate pain or severe pain. 04/01/20   Tommie Samsook, Jayce G, DO    Family History Family History  Problem Relation Age of Onset   Cancer Mother    Diabetes Mother    Cancer Father    Breast cancer Neg Hx     Social History Social History   Tobacco Use   Smoking status: Never   Smokeless tobacco: Never  Vaping Use   Vaping Use: Never used  Substance Use Topics   Alcohol use: No   Drug use: No     Allergies   Metformin and related and Reglan [metoclopramide]   Review of Systems Review of Systems  Constitutional:  Negative for fatigue and fever.  Musculoskeletal:  Positive for arthralgias and joint swelling.  Skin:  Positive for color change and rash. Negative for wound.  Neurological:  Negative for weakness and numbness.    Physical Exam Triage Vital Signs ED Triage Vitals  Enc Vitals Group     BP      Pulse      Resp      Temp      Temp src      SpO2      Weight      Height      Head Circumference      Peak Flow      Pain Score      Pain Loc      Pain Edu?      Excl. in GC?    No data found.  Updated Vital Signs BP (!) 159/84 (BP Location: Left Arm)   Pulse 86   Temp 98 F  (36.7 C) (Oral)   Resp 17   Ht 5\' 6"  (1.676 m)   Wt 138 lb (62.6 kg)   SpO2 98%   BMI 22.27 kg/m    Physical Exam Vitals and nursing note reviewed.  Constitutional:      General: She is not in acute distress.    Appearance: Normal appearance.  She is not ill-appearing or toxic-appearing.  HENT:     Head: Normocephalic and atraumatic.  Eyes:     General: No scleral icterus.       Right eye: No discharge.        Left eye: No discharge.     Conjunctiva/sclera: Conjunctivae normal.  Cardiovascular:     Rate and Rhythm: Normal rate.     Pulses: Normal pulses.  Pulmonary:     Effort: Pulmonary effort is normal. No respiratory distress.  Musculoskeletal:     Cervical back: Neck supple.  Skin:    General: Skin is dry.     Comments: Right foot: Moderate swelling of the dorsal foot with associated mild erythema and warmth.  Area is diffusely tender to palpation.  Very tiny puncture in the middle.  No drainage.  Full range of motion.  Neurological:     General: No focal deficit present.     Mental Status: She is alert. Mental status is at baseline.     Motor: No weakness.     Gait: Gait normal.  Psychiatric:        Mood and Affect: Mood normal.        Behavior: Behavior normal.        Thought Content: Thought content normal.     UC Treatments / Results  Labs (all labs ordered are listed, but only abnormal results are displayed) Labs Reviewed - No data to display  EKG   Radiology No results found.  Procedures Procedures (including critical care time)  Medications Ordered in UC Medications - No data to display  Initial Impression / Assessment and Plan / UC Course  I have reviewed the triage vital signs and the nursing notes.  Pertinent labs & imaging results that were available during my care of the patient were reviewed by me and considered in my medical decision making (see chart for details).  87 year old female presenting for right foot pain, swelling, redness  and itching since being stung by yellow jacket 2 days ago.  Has tried topical hydrocortisone as well as ice and elevation without improvement in symptoms.  She denies associated fever or drainage from the area.  On exam she has moderate swelling of the dorsal foot with mild associated erythema and warmth.  Very tiny puncture noted.  No drainage.  Tenderness palpation diffusely throughout the midfoot.  Exam consistent with localized allergic/inflammatory reaction.  We will treat with prednisone at this time, 40 mg x 5 days.  Also reviewed continuing RICE guidelines and topical hydrocortisone or Benadryl.  Reviewed return and ER precautions.   Final Clinical Impressions(s) / UC Diagnoses   Final diagnoses:  Swelling of right foot  Insect bite of right foot, initial encounter     Discharge Instructions      -I have sent prednisone to pharmacy to help with swelling/inflammation. Keep an eye on your blood pressure and blood sugar while taking as this medication can cause elevations.  -Continue to ice and elevate the foot -Topical Benadryl or hydrocortisone for itching -Return if fever, worsening swelling or pain -This should be improving over the next couple of days     ED Prescriptions     Medication Sig Dispense Auth. Provider   predniSONE (DELTASONE) 20 MG tablet Take 2 tablets (40 mg total) by mouth daily for 5 days. 10 tablet Gareth Morgan      PDMP not reviewed this encounter.   Shirlee Latch, PA-C 04/08/22 [id number]

## 2022-04-08 NOTE — ED Triage Notes (Signed)
Pt reports she was stung by a yellowjacket  on Saturday on top of her right foot. Redness, mild swelling and pain to area

## 2022-04-16 ENCOUNTER — Other Ambulatory Visit: Payer: Self-pay | Admitting: Family Medicine

## 2022-04-16 DIAGNOSIS — Z780 Asymptomatic menopausal state: Secondary | ICD-10-CM

## 2022-05-09 ENCOUNTER — Ambulatory Visit
Admission: RE | Admit: 2022-05-09 | Discharge: 2022-05-09 | Disposition: A | Discharge status: Home / Self Care | Payer: Medicare Other | Source: Ambulatory Visit | Attending: Family Medicine | Admitting: Family Medicine

## 2022-05-09 DIAGNOSIS — Z780 Asymptomatic menopausal state: Secondary | ICD-10-CM | POA: Insufficient documentation

## 2022-05-16 ENCOUNTER — Telehealth: Payer: Self-pay

## 2022-05-16 NOTE — Telephone Encounter (Signed)
Referral from Dr. Lady Gary for Lumbar Radiculitis. Upon review she has had a ESI in March 2023 and per Dr. Yves Dill note says recent PT but I dont see any PT notes. She has also not had a MRI since 2021. Please schedule with Danielle.

## 2022-05-20 NOTE — Telephone Encounter (Signed)
Please schedule with Danielle. 

## 2022-06-13 ENCOUNTER — Ambulatory Visit (INDEPENDENT_AMBULATORY_CARE_PROVIDER_SITE_OTHER): Payer: Medicare Other | Admitting: Neurosurgery

## 2022-06-13 ENCOUNTER — Encounter: Payer: Self-pay | Admitting: Neurosurgery

## 2022-06-13 VITALS — BP 173/79 | HR 80 | Ht 64.0 in | Wt 141.2 lb

## 2022-06-13 DIAGNOSIS — G8929 Other chronic pain: Secondary | ICD-10-CM | POA: Diagnosis not present

## 2022-06-13 DIAGNOSIS — M5441 Lumbago with sciatica, right side: Secondary | ICD-10-CM

## 2022-06-13 DIAGNOSIS — M5416 Radiculopathy, lumbar region: Secondary | ICD-10-CM | POA: Diagnosis not present

## 2022-06-13 DIAGNOSIS — M48061 Spinal stenosis, lumbar region without neurogenic claudication: Secondary | ICD-10-CM

## 2022-06-13 NOTE — Progress Notes (Signed)
Referring Physician:  Dalia Heading, MD [address] [place],  [place] [address]  Primary Physician:  Rayetta Humphrey, MD  History of Present Illness: 06/13/2022 Ms. Dawn Farmer is a 87 y.o with a history of DM, on Eliquis, hypertension, hyperlipidemia, GERD who is here today for further evaluation of lumbosacral complaints. She reports that she has had pain in her low back for about 2 years which has worsened over the last year or so.  She describes pain that starts in her right low back and buttock and radiates down the posterior aspect of her right leg and into her entire foot.  Her back seems to bother her more than her leg however her leg symptoms do occur with activity particular walking for prolonged periods of time.  She is attempted acupuncture, TENS unit, baclofen, tramadol, gabapentin, and Tylenol without significant relief.  She has also had epidural injections which have not provided any relief.  She reports a hospitalization a day or so after her injection in March with severe GI symptoms that she attributed to her injection and is not interested in attempting additional injections.  She also completed physical therapy at the [facility] in [place] in 2021 but states the therapist discharged her as he did not feel that therapy would help her.  She denies any weakness or left-sided symptoms.  Conservative measures:  Physical therapy: PT at their [facility] in May but in 2021 without significant relief Multimodal medical therapy including regular antiinflammatories: As above Injections:  epidural steroid injections 01/23/22- A Right L5-S1 transforaminal epidural injection under fluoroscopic guidance. 2. A Right S1 transforaminal epidural injection under fluoroscopic guidance.  Past Surgery: No previous spinal surgeries.  Orlinda A Cercone has no symptoms of cervical myelopathy.  The symptoms are causing a significant impact on the patient's life.   Review  of Systems:  A 10 point review of systems is negative, except for the pertinent positives and negatives detailed in the HPI.  Past Medical History: Past Medical History:  Diagnosis Date   Anxiety    Arthritis    Diabetes mellitus without complication (HCC)    GERD (gastroesophageal reflux disease)    Heart murmur    Hypertension    Peripheral neuropathy     Past Surgical History: Past Surgical History:  Procedure Laterality Date   ABDOMINAL HYSTERECTOMY     BALLOON DILATION N/A 04/23/2019   Procedure: BALLOON DILATION;  Surgeon: Scot Jun, MD;  Location: [facility] ENDOSCOPY;  Service: Endoscopy;  Laterality: N/A;   CARPAL TUNNEL RELEASE     CHOLECYSTECTOMY     COLONOSCOPY WITH ESOPHAGOGASTRODUODENOSCOPY (EGD)     ESOPHAGOGASTRODUODENOSCOPY (EGD) WITH PROPOFOL N/A 04/23/2019   Procedure: ESOPHAGOGASTRODUODENOSCOPY (EGD) WITH PROPOFOL;  Surgeon: Scot Jun, MD;  Location: [facility] ENDOSCOPY;  Service: Endoscopy;  Laterality: N/A;   rotator cuff tear Right 2016   SHOULDER ARTHROSCOPY     SHOULDER ARTHROSCOPY Right 04/06/2015   Procedure: right shoulder arthroscopy, arthroscopic labral debridement, subacromial decompression, mini open rotator cuff repair;  Surgeon: Christena Flake, MD;  Location: [facility] ORS;  Service: Orthopedics;  Laterality: Right;    Allergies: Allergies as of 06/13/2022 - Review Complete 06/13/2022  Allergen Reaction Noted   Lisinopril Other (See Comments) 11/07/2014   Metformin Diarrhea 03/23/2014   Simvastatin Other (See Comments) 12/05/2014   Metformin and related  03/29/2015   Metoclopramide Other (See Comments) 05/31/2013    Medications: Outpatient Encounter Medications as of 06/13/2022  Medication Sig   apixaban (  ELIQUIS) 5 MG TABS tablet Take 1 tablet (5 mg total) by mouth 2 (two) times daily.   baclofen (LIORESAL) 10 MG tablet Take 10 mg by mouth 2 (two) times daily as needed.   diltiazem (CARDIZEM CD) 180 MG 24 hr capsule Take 1 capsule (180 mg  total) by mouth daily.   gabapentin (NEURONTIN) 600 MG tablet Take 600 mg by mouth 2 (two) times daily.   glipiZIDE (GLUCOTROL XL) 5 MG 24 hr tablet Take 5 mg by mouth 2 (two) times daily.   ketorolac (ACULAR) 0.5 % ophthalmic solution Place 1 drop into both eyes 4 (four) times daily.   losartan (COZAAR) 100 MG tablet Take 100 mg by mouth daily.   losartan (COZAAR) 50 MG tablet Take 1 tablet (50 mg total) by mouth daily.   lovastatin (MEVACOR) 20 MG tablet Take 20 mg by mouth daily at 12 noon.   metoprolol succinate (TOPROL-XL) 50 MG 24 hr tablet Take 1 tablet (50 mg total) by mouth daily. Take with or immediately following a meal.   Multiple Vitamins-Minerals (CENTRUM SILVER ULTRA WOMENS PO) Take 1 capsule by mouth daily at 12 noon.   OMEGA-3 KRILL OIL PO Take 1 capsule by mouth daily at 12 noon.   omeprazole (PRILOSEC) 40 MG capsule Take 40 mg by mouth 2 (two) times daily.   ondansetron (ZOFRAN) 4 MG tablet Take 1 tablet (4 mg total) by mouth daily as needed for nausea or vomiting.   traMADol (ULTRAM) 50 MG tablet Take 1 tablet (50 mg total) by mouth every 8 (eight) hours as needed for moderate pain or severe pain.   [DISCONTINUED] cholestyramine (QUESTRAN) 4 g packet Take 1 packet (4 g total) by mouth 2 (two) times daily for 10 days.   No facility-administered encounter medications on file as of 06/13/2022.    Social History: Social History   Tobacco Use   Smoking status: Never   Smokeless tobacco: Never  Vaping Use   Vaping Use: Never used  Substance Use Topics   Alcohol use: No   Drug use: No    Family Medical History: Family History  Problem Relation Age of Onset   Cancer Mother    Diabetes Mother    Cancer Father    Breast cancer Neg Hx     Physical Examination:  Today's Vitals   06/13/22 1005  BP: (!) 173/79  Pulse: 80  Weight: 64 kg  Height: 5\' 4"  (1.626 m)  PainSc: 8   PainLoc: Back   Body mass index is 24.24 kg/m.   General: Patient is well developed,  well nourished, calm, collected, and in no apparent distress. Attention to examination is appropriate.  Psychiatric: Patient is non-anxious.  Head:  Pupils equal, round, and reactive to light.  ENT:  Oral mucosa appears well hydrated.  Neck:   Supple.    Respiratory: Patient is breathing without any difficulty.  Extremities: No edema.  Vascular: Palpable dorsal pedal pulses.  Skin:   On exposed skin, there are no abnormal skin lesions.  NEUROLOGICAL:     Awake, alert, oriented to person, place, and time.  Speech is clear and fluent. Fund of knowledge is appropriate.   Cranial Nerves: Pupils equal round and reactive to light.  Facial tone is symmetric.  Facial sensation is symmetric.  ROM of spine: full.  Palpation of spine: TTP over right low back and SI joint  Strength: Side Biceps Triceps Deltoid Interossei Grip Wrist Ext. Wrist Flex.  R 5 5 5 5  5  5 5  L 5 5 5 5 5 5 5    Side Iliopsoas Quads Hamstring PF DF EHL  R 5 5 5 5 5 5   L 5 5 5 5 5 5    Reflexes are 1+ and symmetric at the biceps, triceps, brachioradialis, patella and achilles.   Hoffman's is absent.  Clonus is not present.  Toes are down-going.  Bilateral upper and lower extremity sensation is intact to light touch.    Gait is normal.    Medical Decision Making  Imaging: 05/13/20 MRI L spine  IMPRESSION: Degenerative disc disease and degenerative facet disease throughout the region. In this patient with right-sided pain, there is lateral recess and foraminal narrowing asymmetrically more pronounced on the right at L3-4 and L4-5 that could cause right-sided neural compression.   At L2-3, there is left lateral recess and foraminal narrowing that could cause left-sided symptoms, not described in the history.     Electronically Signed   By: M.D.   On: 05/14/2020 15:06  I have personally reviewed the images and agree with the above interpretation.  Assessment and Plan: Ms. Spano is a  pleasant 87 y.o. female with longstanding history of lumbosacral complaints.  She primarily has back pain and right radiating leg pain.  This is likely explained by her degenerative disc disease at L4-5 and corresponding lateral recess stenosis on the right at this level.  Unfortunately she has failed to respond favorably to conservative management however she has not undergone physical therapy in the last year.  We discussed the importance of this and she will return to physical therapy for either 6 weeks of physical therapy or a discharge note should the therapist find that therapy will not be effective for her.  I would also like to get an updated MRI as her symptoms have worsened over the last year and she has not had an MRI since 2021.  She will contact Lauris Poag after she has completed physical therapy or been discharged and should her symptoms continue we will get her scheduled with Dr. 97 discussed possible surgical options.  She was encouraged to call our office in the interim should she have any questions or concerns.  She expressed understanding and was in agreement with this plan.    Thank you for involving me in the care of this patient.   I spent a total of 45 minutes in both face-to-face and non-face-to-face activities for this visit on the date of this encounter including review of outside records, review of imaging, review of symptoms, physical exam, discussion of differential diagnosis, documentation, and order placement.   2022 Dept. of Neurosurgery

## 2022-06-28 ENCOUNTER — Ambulatory Visit
Admission: RE | Admit: 2022-06-28 | Discharge: 2022-06-28 | Disposition: A | Discharge status: Home / Self Care | Payer: Medicare Other | Source: Ambulatory Visit | Attending: Neurosurgery | Admitting: Neurosurgery

## 2022-06-28 DIAGNOSIS — G8929 Other chronic pain: Secondary | ICD-10-CM | POA: Diagnosis present

## 2022-06-28 DIAGNOSIS — M5416 Radiculopathy, lumbar region: Secondary | ICD-10-CM | POA: Insufficient documentation

## 2022-06-28 DIAGNOSIS — M48061 Spinal stenosis, lumbar region without neurogenic claudication: Secondary | ICD-10-CM | POA: Diagnosis present

## 2022-06-28 DIAGNOSIS — M5441 Lumbago with sciatica, right side: Secondary | ICD-10-CM | POA: Diagnosis present

## 2022-07-02 ENCOUNTER — Ambulatory Visit (INDEPENDENT_AMBULATORY_CARE_PROVIDER_SITE_OTHER): Payer: Medicare Other | Admitting: Neurosurgery

## 2022-07-02 DIAGNOSIS — M48062 Spinal stenosis, lumbar region with neurogenic claudication: Secondary | ICD-10-CM

## 2022-07-02 NOTE — Progress Notes (Signed)
I spoke with Ms. Dawn Farmer this morning to review her MRI results. She continues to have right-sided low back pain that radiates into her right buttock and down the posterior aspect of her right leg into her foot which is unchanged from her 8/10 visit. We placed a referral for PT at [facility] in [facility] however she states that she has not heard from the office and therefore has not initiated physical therapy.  I gave her the number and recommended that she call to get scheduled.  She does have oral surgery scheduled for 08/13/22 and is not interested in doing anything about her back from a surgical standpoint till after this has been completed.  I will schedule her for a telephone follow-up few weeks after her surgery to discuss how she is doing with physical therapy and whether or not she needs to be scheduled to see Dr. Marcell Barlow to talk about surgery.  She understands she will need to complete 6 weeks of physical therapy or be discharged from physical therapy before having a discussion regarding surgical intervention.  She was encouraged to call our office in the interim should she have any questions or concerns.  She expressed understanding was in agreement with this plan.  Manning Charity PA-C Neurosurgery

## 2022-10-08 ENCOUNTER — Ambulatory Visit (INDEPENDENT_AMBULATORY_CARE_PROVIDER_SITE_OTHER): Payer: Medicare Other | Admitting: Neurosurgery

## 2022-10-08 DIAGNOSIS — M4727 Other spondylosis with radiculopathy, lumbosacral region: Secondary | ICD-10-CM | POA: Diagnosis not present

## 2022-10-08 DIAGNOSIS — M47817 Spondylosis without myelopathy or radiculopathy, lumbosacral region: Secondary | ICD-10-CM

## 2022-10-08 DIAGNOSIS — M5416 Radiculopathy, lumbar region: Secondary | ICD-10-CM

## 2022-10-08 NOTE — Progress Notes (Signed)
Neurosurgery Telephone (Audio-Only) Note  Requesting Provider     Rayetta Humphrey, MD [address] [place],  [place] [address] T: [phone] F: [phone]  Primary Care Provider Rayetta Humphrey, MD [address] [place] [address] T: [phone] F: [phone]  Telehealth visit was conducted with Dawn Farmer, a 87 y.o. female via telephone.  Pt location: Home Provider location: Office  History of Present Illness: Dawn Farmer is a 87 y.o presenting today via telephone follow up to review her symptoms since her 07/02/22 visit as she was scheduled for oral surgery on October. At her last visit she complained of right-sided low back pain that radiates into her right buttock and down the posterior aspect of her right leg into her foot. Unfortunately her symptoms are unchanged. She was only able to complete one session of PT which did not provide significant relief and is still in the process of getting her oral surgery revised.   General Review of Systems:  A ROS was performed including pertinent positive and negatives as documented.  All other systems are negative.   Prior to Admission medications   Medication Sig Start Date End Date Taking? Authorizing Provider  apixaban (ELIQUIS) 5 MG TABS tablet Take 1 tablet (5 mg total) by mouth 2 (two) times daily. 04/20/20 01/26/22  Charise Killian, MD  baclofen (LIORESAL) 10 MG tablet Take 10 mg by mouth 2 (two) times daily as needed. 01/01/22   [provider]  diltiazem (CARDIZEM CD) 180 MG 24 hr capsule Take 1 capsule (180 mg total) by mouth daily. 04/21/20 01/26/22  Charise Killian, MD  gabapentin (NEURONTIN) 600 MG tablet Take 600 mg by mouth 2 (two) times daily.    [provider]  glipiZIDE (GLUCOTROL XL) 5 MG 24 hr tablet Take 5 mg by mouth 2 (two) times daily. 01/20/20   [provider]  ketorolac (ACULAR) 0.5 % ophthalmic solution Place 1 drop into both eyes 4 (four) times daily.  10/26/21   Cuthriell, Delorise Royals, PA-C  losartan (COZAAR) 100 MG tablet Take 100 mg by mouth daily. 01/01/22   [provider]  losartan (COZAAR) 50 MG tablet Take 1 tablet (50 mg total) by mouth daily. 04/20/20 10/26/21  Charise Killian, MD  lovastatin (MEVACOR) 20 MG tablet Take 20 mg by mouth daily at 12 noon.    [provider]  metoprolol succinate (TOPROL-XL) 50 MG 24 hr tablet Take 1 tablet (50 mg total) by mouth daily. Take with or immediately following a meal. 04/21/20 10/26/21  Charise Killian, MD  Multiple Vitamins-Minerals (CENTRUM SILVER ULTRA WOMENS PO) Take 1 capsule by mouth daily at 12 noon.    [provider]  OMEGA-3 KRILL OIL PO Take 1 capsule by mouth daily at 12 noon.    [provider]  omeprazole (PRILOSEC) 40 MG capsule Take 40 mg by mouth 2 (two) times daily.    [provider]  ondansetron (ZOFRAN) 4 MG tablet Take 1 tablet (4 mg total) by mouth daily as needed for nausea or vomiting. 01/29/22 01/29/23  Tresa Moore, MD  traMADol (ULTRAM) 50 MG tablet Take 1 tablet (50 mg total) by mouth every 8 (eight) hours as needed for moderate pain or severe pain. 04/01/20   Tommie Sams, DO    DATA REVIEWED    Imaging Studies  06/28/22 MRI L spine  FINDINGS: Segmentation: Normal, the same numbering system used on the 2021 MRI.   Alignment: Stable  lumbar lordosis since 2021. Mild dextroconvex lumbar scoliosis. No significant spondylolisthesis.   Vertebrae: Diffuse chronic disc and endplate degeneration. Mild to moderate endplate marrow edema at L3-L4 posteriorly and on the right is new since 2021. Faint chronic degenerative endplate edema at J18 and L1. Background bone marrow signal remains normal. Intact visible sacrum and SI joints.   Conus medullaris and cauda equina: Conus extends to the T12-L1 level. No lower spinal cord or conus signal abnormality.   Paraspinal and other soft tissues: Negative for age.    Disc levels:   Chronic lower thoracic disc and endplate degeneration does not appear significantly changed. No lower thoracic spinal stenosis.   L1-L2: Chronic severe disc space loss with disc osteophyte complex mostly affecting the foramina. No spinal stenosis. Mild L1 foraminal stenosis is stable.   L2-L3: Disc space loss with disc osteophyte complex eccentric to the left. Mild to moderate facet and ligament flavum hypertrophy. Moderate to severe left lateral recess stenosis with mild spinal stenosis appears stable. Stable moderate to severe left L2 foraminal stenosis.   L3-L4: Chronic disc space loss, disc osteophyte complex and mild to moderate facet and ligament flavum hypertrophy. Mild spinal stenosis here appears mildly progressed on series 8, image 17. Moderate left and mild right L3 foraminal stenosis is stable.   L4-L5: Bulky circumferential disc bulge and moderate to severe facet and ligament flavum hypertrophy. Progressed and now severe spinal stenosis (series 8, image 21) with fairly symmetric lateral recess stenosis (L5 nerve levels). Mild to moderate left and moderate to severe right L4 foraminal stenosis appears stable.   L5-S1: Chronic mostly far lateral disc osteophyte complex greater on the right. Chronic severe facet and ligament flavum hypertrophy. Borderline to mild spinal stenosis is stable. No convincing lateral recess stenosis. Mild left but moderate right L5 foraminal stenosis. This level stable.   IMPRESSION: 1. Diffuse chronic disc and endplate degeneration. Progressed since 2021 and now severe multifactorial spinal stenosis at L4-L5. Symmetric lateral recess stenosis. Chronic moderate to severe right foraminal stenosis appears stable. Query right L4 and/or L5 radiculitis.   2. Mild spinal stenosis at L3-L4 has mildly increased, with new degenerative endplate marrow edema at that level.   3. Other lumbar levels are stable, including moderate to  severe left lateral recess and left foraminal stenosis at L2-L3.     Electronically Signed   By: Odessa Fleming M.D.   On: 06/30/2022 11:44  IMPRESSION  Dawn Farmer is a 87 y.o. female who I performed a telephone encounter today for evaluation and management of lumbar stenosis with radiculopathy   PLAN  I spoke with Dawn Farmer in detail regarding her options. She is not interested in returning to PT until after the first of the year due to multiple other doctors appointments. We discussed that she would need to complete 6 full whichs of PT in order to consider surgical intervention. She will let us know when she is able to return and we will place a referral. Should she fail to improve with PT, we will get her scheduled to see Dr. Myer Haff to discuss surgical options should she chose to do so. I will otherwise see her going forward on an as needed basis. She expressed understanding and was in agreement with this plan.   No orders of the defined types were placed in this encounter.   DISPOSITION  Follow up: In person appointment in  PRN  Susanne Borders, PA   TELEPHONE DOCUMENTATION   I spent a total  of 17 minutes in non-face-to-face activities for this visit on the date of this encounter including review of previous records, review of imaging, discussion of symptoms, discussion of plan of care, and documentation.

## 2024-02-24 ENCOUNTER — Telehealth: Payer: Self-pay

## 2024-02-24 ENCOUNTER — Ambulatory Visit (INDEPENDENT_AMBULATORY_CARE_PROVIDER_SITE_OTHER)

## 2024-02-24 ENCOUNTER — Ambulatory Visit
Admission: EM | Admit: 2024-02-24 | Discharge: 2024-02-24 | Disposition: A | Discharge status: Home / Self Care | Attending: Emergency Medicine | Admitting: Emergency Medicine

## 2024-02-24 DIAGNOSIS — R7989 Other specified abnormal findings of blood chemistry: Secondary | ICD-10-CM | POA: Insufficient documentation

## 2024-02-24 DIAGNOSIS — E860 Dehydration: Secondary | ICD-10-CM | POA: Insufficient documentation

## 2024-02-24 DIAGNOSIS — U071 COVID-19: Secondary | ICD-10-CM

## 2024-02-24 LAB — COMPREHENSIVE METABOLIC PANEL WITH GFR
ALT: 19 U/L (ref 0–44)
AST: 25 U/L (ref 15–41)
Albumin: 4.4 g/dL (ref 3.5–5.0)
Alkaline Phosphatase: 56 U/L (ref 38–126)
Anion gap: 12 (ref 5–15)
BUN: 17 mg/dL (ref 8–23)
CO2: 24 mmol/L (ref 22–32)
Calcium: 9.4 mg/dL (ref 8.9–10.3)
Chloride: 100 mmol/L (ref 98–111)
Creatinine, Ser: 1.03 mg/dL — ABNORMAL HIGH (ref 0.44–1.00)
GFR, Estimated: 53 mL/min — ABNORMAL LOW (ref >60)
Glucose, Bld: 110 mg/dL — ABNORMAL HIGH (ref 70–99)
Potassium: 3.7 mmol/L (ref 3.5–5.1)
Sodium: 136 mmol/L (ref 135–145)
Total Bilirubin: 1.4 mg/dL — ABNORMAL HIGH (ref 0.0–1.2)
Total Protein: 8.2 g/dL — ABNORMAL HIGH (ref 6.5–8.1)

## 2024-02-24 LAB — CBC WITH DIFFERENTIAL/PLATELET
Abs Immature Granulocytes: 0.02 10*3/uL (ref 0.00–0.07)
Basophils Absolute: 0.1 10*3/uL (ref 0.0–0.1)
Basophils Relative: 1 %
Eosinophils Absolute: 0.1 10*3/uL (ref 0.0–0.5)
Eosinophils Relative: 1 %
HCT: 40.7 % (ref 36.0–46.0)
Hemoglobin: 13.7 g/dL (ref 12.0–15.0)
Immature Granulocytes: 0 %
Lymphocytes Relative: 22 %
Lymphs Abs: 1.6 10*3/uL (ref 0.7–4.0)
MCH: 30 pg (ref 26.0–34.0)
MCHC: 33.7 g/dL (ref 30.0–36.0)
MCV: 89.1 fL (ref 80.0–100.0)
Monocytes Absolute: 1 10*3/uL (ref 0.1–1.0)
Monocytes Relative: 13 %
Neutro Abs: 4.6 10*3/uL (ref 1.7–7.7)
Neutrophils Relative %: 63 %
Platelets: 172 10*3/uL (ref 150–400)
RBC: 4.57 MIL/uL (ref 3.87–5.11)
RDW: 12.7 % (ref 11.5–15.5)
WBC: 7.3 10*3/uL (ref 4.0–10.5)
nRBC: 0 % (ref 0.0–0.2)

## 2024-02-24 LAB — RESP PANEL BY RT-PCR (RSV, FLU A&B, COVID)  RVPGX2
Influenza A by PCR: NEGATIVE
Influenza B by PCR: NEGATIVE
Resp Syncytial Virus by PCR: NEGATIVE
SARS Coronavirus 2 by RT PCR: POSITIVE — AB

## 2024-02-24 MED ORDER — ONDANSETRON 4 MG PO TBDP: 4.0000 mg | ORAL_TABLET | Freq: Three times a day (TID) | ORAL | 0 refills | Status: AC | PRN

## 2024-02-24 MED ORDER — MOLNUPIRAVIR EUA 200MG CAPSULE: 4.0000 | ORAL_CAPSULE | Freq: Two times a day (BID) | ORAL | 0 refills | Status: AC

## 2024-02-24 MED ORDER — ONDANSETRON 4 MG PO TBDP
4.0000 mg | ORAL_TABLET | Freq: Once | ORAL | Status: AC
  Administered 2024-02-24: 4 mg via ORAL

## 2024-02-24 NOTE — Discharge Instructions (Addendum)
 I did not appreciate any pneumonia on your x-ray.  I will contact you if radiology overread differs enough from mine and we need to change management.  Push electrolyte containing fluids such as Pedialyte, liquid IV, Gatorade until your urine is clear.  Zofran  will help slow down the diarrhea and also help with nausea/vomiting.  Tylenol  3-4 times a day as needed for pain.  Finish the molnupiravir , even if you feel better.  Follow-up with your primary care provider in a week to have your kidney function rechecked.  Go to the emergency department for the signs and symptoms we discussed.

## 2024-02-24 NOTE — ED Triage Notes (Signed)
 Pt c/o diarrhea,emesis & chills x3 days. States unable to keep any foods or fluids down. Denies any abd pain or fevers.

## 2024-02-24 NOTE — Telephone Encounter (Signed)
 TC from [facility] regarding the cost of Lagevrio . Pharmacist stated patient is not able to afford the medication and is requesting something different. Per Dr. Mauricia South, patient cannot take Paxlovid due to current medication she is taking.

## 2024-02-24 NOTE — ED Provider Notes (Signed)
 HPI  SUBJECTIVE:  Dawn Farmer is a 89 y.o. female who presents with 3 days of watery, nonbloody, dark diarrhea, sore throat, headaches, nasal congestion, rhinorrhea, cough, dizziness described as lightheadedness present only with walking, 2 episodes of nonbilious, nonbloody emesis.  She is reporting 2-3 episodes of diarrhea per day.  No fevers, postnasal drip, change in her baseline wheezing, shortness of breath, nausea, abdominal pain, change in urine output.  She has not taken any of her medications in 3 days because she has not felt well.  She has not been able to tolerate any p.o. in the past 3 days.  She has had the original COVID vaccines and all of the boosters.  She tried Pedialyte which made her vomit.  No alleviating factors.  She has a past medical history of atrial fibrillation, diabetes, hypertension, hypercholesterolemia.  PCP: [facility].  Additional history obtained from family member. Past Medical History:  Diagnosis Date   Anxiety    Arthritis    Diabetes mellitus without complication (HCC)    GERD (gastroesophageal reflux disease)    Heart murmur    Hypertension    Peripheral neuropathy     Past Surgical History:  Procedure Laterality Date   ABDOMINAL HYSTERECTOMY     BALLOON DILATION N/A 04/23/2019   Procedure: BALLOON DILATION;  Surgeon: Cassie Click, MD;  Location: [facility] ENDOSCOPY;  Service: Endoscopy;  Laterality: N/A;   CARPAL TUNNEL RELEASE     CHOLECYSTECTOMY     COLONOSCOPY WITH ESOPHAGOGASTRODUODENOSCOPY (EGD)     ESOPHAGOGASTRODUODENOSCOPY (EGD) WITH PROPOFOL  N/A 04/23/2019   Procedure: ESOPHAGOGASTRODUODENOSCOPY (EGD) WITH PROPOFOL ;  Surgeon: Cassie Click, MD;  Location: [facility] ENDOSCOPY;  Service: Endoscopy;  Laterality: N/A;   rotator cuff tear Right 2016   SHOULDER ARTHROSCOPY     SHOULDER ARTHROSCOPY Right 04/06/2015   Procedure: right shoulder arthroscopy, arthroscopic labral debridement, subacromial decompression, mini open rotator  cuff repair;  Surgeon: Elner Hahn, MD;  Location: [facility] ORS;  Service: Orthopedics;  Laterality: Right;    Family History  Problem Relation Age of Onset   Cancer Mother    Diabetes Mother    Cancer Father    Breast cancer Neg Hx     Social History   Tobacco Use   Smoking status: Never   Smokeless tobacco: Never  Vaping Use   Vaping status: Never Used  Substance Use Topics   Alcohol use: No   Drug use: No    No current facility-administered medications for this encounter.  Current Outpatient Medications:    apixaban  (ELIQUIS ) 5 MG TABS tablet, Take 1 tablet (5 mg total) by mouth 2 (two) times daily., Disp: 60 tablet, Rfl: 0   baclofen (LIORESAL) 10 MG tablet, Take 10 mg by mouth 2 (two) times daily as needed., Disp: , Rfl:    diltiazem  (CARDIZEM  CD) 180 MG 24 hr capsule, Take 1 capsule (180 mg total) by mouth daily., Disp: 30 capsule, Rfl: 0   gabapentin  (NEURONTIN ) 600 MG tablet, Take 600 mg by mouth 2 (two) times daily., Disp: , Rfl:    glipiZIDE (GLUCOTROL XL) 5 MG 24 hr tablet, Take 5 mg by mouth 2 (two) times daily., Disp: , Rfl:    hydrALAZINE  (APRESOLINE ) 25 MG tablet, Take 25 mg by mouth 3 (three) times daily., Disp: , Rfl:    losartan  (COZAAR ) 100 MG tablet, Take 100 mg by mouth daily., Disp: , Rfl:    losartan  (COZAAR ) 50 MG tablet, Take 1 tablet (50 mg total) by  mouth daily., Disp: , Rfl:    lovastatin (MEVACOR) 20 MG tablet, Take 20 mg by mouth daily at 12 noon., Disp: , Rfl:    metoprolol  succinate (TOPROL -XL) 50 MG 24 hr tablet, Take 1 tablet (50 mg total) by mouth daily. Take with or immediately following a meal., Disp: 30 tablet, Rfl: 0   molnupiravir  EUA (LAGEVRIO ) 200 mg CAPS capsule, Take 4 capsules (800 mg total) by mouth 2 (two) times daily for 5 days., Disp: 40 capsule, Rfl: 0   Multiple Vitamins-Minerals (CENTRUM SILVER ULTRA WOMENS PO), Take 1 capsule by mouth daily at 12 noon., Disp: , Rfl:    OMEGA-3 KRILL OIL PO, Take 1 capsule by mouth daily at 12  noon., Disp: , Rfl:    omeprazole (PRILOSEC) 40 MG capsule, Take 40 mg by mouth 2 (two) times daily., Disp: , Rfl:    ondansetron  (ZOFRAN -ODT) 4 MG disintegrating tablet, Take 1 tablet (4 mg total) by mouth every 8 (eight) hours as needed for nausea or vomiting., Disp: 20 tablet, Rfl: 0  Allergies  Allergen Reactions   Lisinopril Other (See Comments)   Metformin Diarrhea   Simvastatin Other (See Comments)     "feels funny"   Metformin And Related    Metoclopramide  Other (See Comments)    "Feel funny"     ROS  As noted in HPI.   Physical Exam  BP (!) 158/86 (BP Location: Left Arm)   Pulse (!) 105   Temp 98.3 F (36.8 C) (Oral)   Resp 16   Ht 5\' 6"  (1.676 m)   Wt 61.2 kg   SpO2 97%   BMI 21.79 kg/m   Constitutional: Well developed, well nourished, no acute distress Eyes:  EOMI, conjunctiva normal bilaterally HENT: Normocephalic, atraumatic,mucus membranes moist Respiratory: Normal inspiratory effort, lungs clear bilaterally Cardiovascular: regular tachycardia no murmurs, rub, gallops.  Cap refill approximately 2 seconds. GI: Soft, nontender, nondistended, active bowel sounds, no rebound, guarding skin: No rash, skin intact Musculoskeletal: no deformities Neurologic: Alert & oriented x 3, no focal neuro deficits Psychiatric: Speech and behavior appropriate   ED Course   Medications  ondansetron  (ZOFRAN -ODT) disintegrating tablet 4 mg (4 mg Oral Given 02/24/24 1307)    Orders Placed This Encounter  Procedures   Resp panel by RT-PCR (RSV, Flu A&B, Covid) Anterior Nasal Swab    Standing Status:   Standing    Number of Occurrences:   1   DG Chest 2 View    Standing Status:   Standing    Number of Occurrences:   1    Reason for Exam (SYMPTOM  OR DIAGNOSIS REQUIRED):   covid + with cough r/o PNA   Comprehensive metabolic panel    Standing Status:   Standing    Number of Occurrences:   1   CBC with Differential    Standing Status:   Standing    Number of  Occurrences:   1   Offer Fluids    Standing Status:   Standing    Number of Occurrences:   20   Recheck vitals    Standing Status:   Standing    Number of Occurrences:   1   Airborne and Contact precautions    Standing Status:   Standing    Number of Occurrences:   1    Results for orders placed or performed during the hospital encounter of 02/24/24 (from the past 24 hours)  Resp panel by RT-PCR (RSV, Flu A&B, Covid) Anterior Nasal Swab  Status: Abnormal   Collection Time: 02/24/24 12:01 PM   Specimen: Anterior Nasal Swab  Result Value Ref Range   SARS Coronavirus 2 by RT PCR POSITIVE (A) NEGATIVE   Influenza A by PCR NEGATIVE NEGATIVE   Influenza B by PCR NEGATIVE NEGATIVE   Resp Syncytial Virus by PCR NEGATIVE NEGATIVE  Comprehensive metabolic panel     Status: Abnormal   Collection Time: 02/24/24  1:07 PM  Result Value Ref Range   Sodium 136 135 - 145 mmol/L   Potassium 3.7 3.5 - 5.1 mmol/L   Chloride 100 98 - 111 mmol/L   CO2 24 22 - 32 mmol/L   Glucose, Bld 110 (H) 70 - 99 mg/dL   BUN 17 8 - 23 mg/dL   Creatinine, Ser 1.61 (H) 0.44 - 1.00 mg/dL   Calcium  9.4 8.9 - 10.3 mg/dL   Total Protein 8.2 (H) 6.5 - 8.1 g/dL   Albumin 4.4 3.5 - 5.0 g/dL   AST 25 15 - 41 U/L   ALT 19 0 - 44 U/L   Alkaline Phosphatase 56 38 - 126 U/L   Total Bilirubin 1.4 (H) 0.0 - 1.2 mg/dL   GFR, Estimated 53 (L) >60 mL/min   Anion gap 12 5 - 15  CBC with Differential     Status: None   Collection Time: 02/24/24  1:07 PM  Result Value Ref Range   WBC 7.3 4.0 - 10.5 K/uL   RBC 4.57 3.87 - 5.11 MIL/uL   Hemoglobin 13.7 12.0 - 15.0 g/dL   HCT 09.6 04.5 - 40.9 %   MCV 89.1 80.0 - 100.0 fL   MCH 30.0 26.0 - 34.0 pg   MCHC 33.7 30.0 - 36.0 g/dL   RDW 81.1 91.4 - 78.2 %   Platelets 172 150 - 400 K/uL   nRBC 0.0 0.0 - 0.2 %   Neutrophils Relative % 63 %   Neutro Abs 4.6 1.7 - 7.7 K/uL   Lymphocytes Relative 22 %   Lymphs Abs 1.6 0.7 - 4.0 K/uL   Monocytes Relative 13 %   Monocytes  Absolute 1.0 0.1 - 1.0 K/uL   Eosinophils Relative 1 %   Eosinophils Absolute 0.1 0.0 - 0.5 K/uL   Basophils Relative 1 %   Basophils Absolute 0.1 0.0 - 0.1 K/uL   Immature Granulocytes 0 %   Abs Immature Granulocytes 0.02 0.00 - 0.07 K/uL   DG Chest 2 View Result Date: 02/24/2024 CLINICAL DATA:  COVID, cough EXAM: CHEST - 2 VIEW COMPARISON:  04/19/2020 FINDINGS: Heart and mediastinal contours are within normal limits. No focal opacities or effusions. No acute bony abnormality. Aortic atherosclerosis. IMPRESSION: No active cardiopulmonary disease. Electronically Signed   By: Janeece Mechanic M.D.   On: 02/24/2024 14:39    ED Clinical Impression  1. COVID-19 virus infection   2. Dehydration   3. Elevated serum creatinine      ED Assessment/Plan     COVID-positive.  I offered early transfer to the emergency department, however patient does not want to go.   discussed with family member and patient that we could do a workup here including CBC, CMP, UA to check hydration status, chest x-ray and try p.o. challenge, but if I find anything significantly abnormal or if she is unable to tolerate p.o., I will be sending her to the emergency department.  They agree with this plan.  She wwas unable to give a urine sample while in the department. Doubt concurrent uti, cancelling this.  Labs reviewed and discussed with patient and family.  CBC normal.  Creatinine slightly elevated at 1.03 above her baseline of 0.89, but I do not think this is acute renal failure.  Her glucose, electrolytes, liver enzymes are normal.  Slight elevation total bilirubin.  GFR 53.  Will send home with molnupiravir  due to interaction of Paxlovid and Eliquis .  I am not going to discontinue her Eliquis .  Zofran , Tylenol , push electrolyte containing fluids.  Follow-up with PCP in 1 week to have BMP rechecked.  Strict ER return precautions given the patient and granddaughter.  Repeat vitals after po fluids-  Heart rate trending  down.  Still afebrile.  Blood pressure stable.  Tolerating p.o.  Reviewed imaging independently.  No obvious consolidation as read by me.  Formal radiology overread pending.  Will contact granddaughter Jacqlyn Matas at [phone] if radiology overread differs from mine and we need to change management.    Radiology report reviewed.  No acute cardiopulmonary disease consistent with my read.  See radiology report for full details.  Discussed labs, imaging, MDM, treatment plan, and plan for follow-up with patient. Discussed sn/sx that should prompt return to the ED. patient agrees with plan.   Meds ordered this encounter  Medications   ondansetron  (ZOFRAN -ODT) disintegrating tablet 4 mg   molnupiravir  EUA (LAGEVRIO ) 200 mg CAPS capsule    Sig: Take 4 capsules (800 mg total) by mouth 2 (two) times daily for 5 days.    Dispense:  40 capsule    Refill:  0   ondansetron  (ZOFRAN -ODT) 4 MG disintegrating tablet    Sig: Take 1 tablet (4 mg total) by mouth every 8 (eight) hours as needed for nausea or vomiting.    Dispense:  20 tablet    Refill:  0      *This clinic note was created using Scientist, clinical (histocompatibility and immunogenetics). Therefore, there may be occasional mistakes despite careful proofreading.  ?    Ethlyn Herd, MD 02/25/24 [phone]

## 2024-04-14 ENCOUNTER — Other Ambulatory Visit: Payer: Self-pay | Admitting: Family Medicine

## 2024-04-14 DIAGNOSIS — R519 Headache, unspecified: Secondary | ICD-10-CM

## 2024-04-14 DIAGNOSIS — E119 Type 2 diabetes mellitus without complications: Secondary | ICD-10-CM

## 2024-04-16 ENCOUNTER — Ambulatory Visit
Admission: RE | Admit: 2024-04-16 | Discharge: 2024-04-16 | Disposition: A | Discharge status: Home / Self Care | Source: Ambulatory Visit | Attending: Family Medicine | Admitting: Family Medicine

## 2024-04-16 DIAGNOSIS — R519 Headache, unspecified: Secondary | ICD-10-CM | POA: Insufficient documentation

## 2024-04-16 DIAGNOSIS — E119 Type 2 diabetes mellitus without complications: Secondary | ICD-10-CM | POA: Diagnosis present

## 2024-10-29 ENCOUNTER — Ambulatory Visit
Admission: RE | Admit: 2024-10-29 | Discharge: 2024-10-29 | Disposition: A | Discharge status: Home / Self Care | Source: Ambulatory Visit | Attending: Emergency Medicine | Admitting: Emergency Medicine

## 2024-10-29 ENCOUNTER — Ambulatory Visit

## 2024-10-29 VITALS — BP 140/74 | HR 82 | Temp 98.0°F | Resp 18

## 2024-10-29 DIAGNOSIS — R051 Acute cough: Secondary | ICD-10-CM | POA: Diagnosis not present

## 2024-10-29 DIAGNOSIS — J069 Acute upper respiratory infection, unspecified: Secondary | ICD-10-CM | POA: Diagnosis not present

## 2024-10-29 DIAGNOSIS — R197 Diarrhea, unspecified: Secondary | ICD-10-CM | POA: Diagnosis not present

## 2024-10-29 MED ORDER — IPRATROPIUM BROMIDE 0.06 % NA SOLN: 2.0000 | Freq: Four times a day (QID) | NASAL | 12 refills | Status: AC

## 2024-10-29 MED ORDER — PROMETHAZINE-DM 6.25-15 MG/5ML PO SYRP: 5.0000 mL | ORAL_SOLUTION | Freq: Four times a day (QID) | ORAL | 0 refills | Status: AC | PRN

## 2024-10-29 MED ORDER — BENZONATATE 100 MG PO CAPS: 200.0000 mg | ORAL_CAPSULE | Freq: Three times a day (TID) | ORAL | 0 refills | Status: AC

## 2024-10-29 NOTE — Discharge Instructions (Addendum)
 Your chest x-ray did not show any evidence of pneumonia.  I do believe you have an upper respiratory tract infection.  Use over-the-counter Tylenol  according to the package instructions as needed for any fever or pain.  I have given you a list of food choices to help you with your diarrhea.  Your diarrhea may be a result of your body trying to rid yourself of the infection which can cause gastrointestinal irritation.  Use the Atrovent  nasal spray, 2 squirts in each nostril every 6 hours, as needed for runny nose and postnasal drip.  Use the Tessalon  Perles every 8 hours during the day.  Take them with a small sip of water.  They may give you some numbness to the base of your tongue or a metallic taste in your mouth, this is normal.  Use the Promethazine  DM cough syrup at bedtime for cough and congestion.  It will make you drowsy so do not take it during the day.  Return for reevaluation or see your primary care provider for any new or worsening symptoms.  If you develop any abdominal pain, vomiting we cannot keep down fluids, fever, or blood in your stool you need to go to the ER for evaluation.

## 2024-10-29 NOTE — ED Provider Notes (Signed)
 " [facility]    CSN: [id number] Arrival date & time: 10/29/24  9141      History   Chief Complaint Chief Complaint  Patient presents with   Nasal Congestion    HPI Dawn Farmer is a 89 y.o. female.   HPI  89 year old female with past medical history significant for hyponatremia, atrial fibrillation, diabetes, hypertension, NVD, and hyperlipidemia presents for evaluation of runny nose, nasal congestion, productive cough for yellow sputum with shortness breath and wheezing has been going on for the past week.  She has been experiencing diarrhea stools only when she eats for the past 15 days since returning from [place] .  She denies any fever, ear pain, nausea vomiting, abdominal pain, or blood in her stool.  Past Medical History:  Diagnosis Date   Anxiety    Arthritis    Diabetes mellitus without complication (HCC)    GERD (gastroesophageal reflux disease)    Heart murmur    Hypertension    Peripheral neuropathy     Patient Active Problem List   Diagnosis Date Noted   Intractable nausea and vomiting 01/27/2022   Nausea vomiting and diarrhea 01/26/2022   Atrial fibrillation, chronic (HCC) 01/26/2022   Abdominal pain 01/26/2022   HLD (hyperlipidemia) 01/26/2022   Atrial fibrillation (HCC) 04/19/2020   Hyponatremia 04/18/2020   Atrial fibrillation with RVR (HCC) 04/18/2020   Atrial fibrillation with rapid ventricular response (HCC) 04/18/2020   Diabetes mellitus without complication (HCC)    Hypertension     Past Surgical History:  Procedure Laterality Date   ABDOMINAL HYSTERECTOMY     BALLOON DILATION N/A 04/23/2019   Procedure: BALLOON DILATION;  Surgeon: Viktoria Lamar DASEN, MD;  Location: [facility] ENDOSCOPY;  Service: Endoscopy;  Laterality: N/A;   CARPAL TUNNEL RELEASE     CHOLECYSTECTOMY     COLONOSCOPY WITH ESOPHAGOGASTRODUODENOSCOPY (EGD)     ESOPHAGOGASTRODUODENOSCOPY (EGD) WITH PROPOFOL  N/A 04/23/2019   Procedure: ESOPHAGOGASTRODUODENOSCOPY  (EGD) WITH PROPOFOL ;  Surgeon: Viktoria Lamar DASEN, MD;  Location: [facility] ENDOSCOPY;  Service: Endoscopy;  Laterality: N/A;   rotator cuff tear Right 2016   SHOULDER ARTHROSCOPY     SHOULDER ARTHROSCOPY Right 04/06/2015   Procedure: right shoulder arthroscopy, arthroscopic labral debridement, subacromial decompression, mini open rotator cuff repair;  Surgeon: Norleen JINNY Maltos, MD;  Location: [facility] ORS;  Service: Orthopedics;  Laterality: Right;    OB History   No obstetric history on file.      Home Medications    Prior to Admission medications  Medication Sig Start Date End Date Taking? Authorizing Provider  benzonatate  (TESSALON ) 100 MG capsule Take 2 capsules (200 mg total) by mouth every 8 (eight) hours. 10/29/24  Yes Bernardino Ditch, NP  ipratropium (ATROVENT ) 0.06 % nasal spray Place 2 sprays into both nostrils 4 (four) times daily. 10/29/24  Yes Bernardino Ditch, NP  promethazine -dextromethorphan (PROMETHAZINE -DM) 6.25-15 MG/5ML syrup Take 5 mLs by mouth 4 (four) times daily as needed. 10/29/24  Yes Bernardino Ditch, NP  apixaban  (ELIQUIS ) 5 MG TABS tablet Take 1 tablet (5 mg total) by mouth 2 (two) times daily. 04/20/20 02/24/24  Trudy Anthony HERO, MD  baclofen (LIORESAL) 10 MG tablet Take 10 mg by mouth 2 (two) times daily as needed. 01/01/22   [provider]  diltiazem  (CARDIZEM  CD) 180 MG 24 hr capsule Take 1 capsule (180 mg total) by mouth daily. 04/21/20 02/24/24  Trudy Anthony HERO, MD  gabapentin  (NEURONTIN ) 600 MG tablet Take 600 mg by mouth 2 (two) times daily.  [provider]  glipiZIDE (GLUCOTROL XL) 5 MG 24 hr tablet Take 5 mg by mouth 2 (two) times daily. 01/20/20   [provider]  hydrALAZINE  (APRESOLINE ) 25 MG tablet Take 25 mg by mouth 3 (three) times daily.    [provider]  losartan  (COZAAR ) 100 MG tablet Take 100 mg by mouth daily. 01/01/22   [provider]  losartan  (COZAAR ) 50 MG tablet Take 1 tablet (50 mg total) by mouth daily.  04/20/20 02/24/24  Trudy Anthony HERO, MD  lovastatin (MEVACOR) 20 MG tablet Take 20 mg by mouth daily at 12 noon.    [provider]  metoprolol  succinate (TOPROL -XL) 50 MG 24 hr tablet Take 1 tablet (50 mg total) by mouth daily. Take with or immediately following a meal. 04/21/20 02/24/24  Trudy Anthony HERO, MD  Multiple Vitamins-Minerals (CENTRUM SILVER ULTRA WOMENS PO) Take 1 capsule by mouth daily at 12 noon.    [provider]  OMEGA-3 KRILL OIL PO Take 1 capsule by mouth daily at 12 noon.    [provider]  omeprazole (PRILOSEC) 40 MG capsule Take 40 mg by mouth 2 (two) times daily.    [provider]  ondansetron  (ZOFRAN -ODT) 4 MG disintegrating tablet Take 1 tablet (4 mg total) by mouth every 8 (eight) hours as needed for nausea or vomiting. 02/24/24   Van Knee, MD    Family History Family History  Problem Relation Age of Onset   Cancer Mother    Diabetes Mother    Cancer Father    Breast cancer Neg Hx     Social History Social History[1]   Allergies   Lisinopril, Metformin, Simvastatin, Lovastatin, Metformin and related, and Metoclopramide    Review of Systems Review of Systems  Constitutional:  Negative for fever.  HENT:  Positive for congestion and rhinorrhea. Negative for ear pain and sore throat.   Respiratory:  Positive for cough, shortness of breath and wheezing.   Gastrointestinal:  Positive for diarrhea. Negative for abdominal pain, blood in stool, nausea and vomiting.     Physical Exam Triage Vital Signs ED Triage Vitals  Encounter Vitals Group     BP 10/29/24 0922 (!) 140/74     Girls Systolic BP Percentile --      Girls Diastolic BP Percentile --      Boys Systolic BP Percentile --      Boys Diastolic BP Percentile --      Pulse Rate 10/29/24 0922 82     Resp 10/29/24 0922 18     Temp 10/29/24 0922 98 F (36.7 C)     Temp Source 10/29/24 0922 Oral     SpO2 10/29/24 0922 97 %     Weight --      Height --       Head Circumference --      Peak Flow --      Pain Score 10/29/24 0921 0     Pain Loc --      Pain Education --      Exclude from Growth Chart --    No data found.  Updated Vital Signs BP (!) 140/74 (BP Location: Left Arm)   Pulse 82   Temp 98 F (36.7 C) (Oral)   Resp 18   SpO2 97%   Visual Acuity Right Eye Distance:   Left Eye Distance:   Bilateral Distance:    Right Eye Near:   Left Eye Near:    Bilateral Near:  Physical Exam Vitals and nursing note reviewed.  Constitutional:      Appearance: Normal appearance. She is not ill-appearing.  HENT:     Head: Normocephalic and atraumatic.     Right Ear: Tympanic membrane, ear canal and external ear normal. There is no impacted cerumen.     Left Ear: Tympanic membrane, ear canal and external ear normal. There is no impacted cerumen.     Nose: Congestion present. No rhinorrhea.     Comments: Nasal mucosa is mildly edematous but free of discharge or erythema.    Mouth/Throat:     Mouth: Mucous membranes are moist.     Pharynx: Oropharynx is clear. No oropharyngeal exudate or posterior oropharyngeal erythema.  Cardiovascular:     Rate and Rhythm: Normal rate and regular rhythm.     Pulses: Normal pulses.     Heart sounds: Normal heart sounds. No murmur heard.    No friction rub. No gallop.  Pulmonary:     Effort: Pulmonary effort is normal.     Breath sounds: Normal breath sounds. No wheezing, rhonchi or rales.  Musculoskeletal:     Cervical back: Normal range of motion and neck supple. No tenderness.  Lymphadenopathy:     Cervical: No cervical adenopathy.  Skin:    General: Skin is warm and dry.     Capillary Refill: Capillary refill takes less than 2 seconds.     Findings: No rash.  Neurological:     General: No focal deficit present.     Mental Status: She is alert and oriented to person, place, and time.      UC Treatments / Results  Labs (all labs ordered are listed, but only abnormal results are  displayed) Labs Reviewed - No data to display  EKG   Radiology DG Chest 2 View Result Date: 10/29/2024 CLINICAL DATA:  Productive cough for 1 week EXAM: CHEST - 2 VIEW COMPARISON:  February 24, 2024 FINDINGS: The heart size and mediastinal contours are within normal limits. Both lungs are clear. The visualized skeletal structures are unremarkable. IMPRESSION: No active cardiopulmonary disease. Electronically Signed   By: Lynwood Landy Raddle M.D.   On: 10/29/2024 10:03    Procedures Procedures (including critical care time)  Medications Ordered in UC Medications - No data to display  Initial Impression / Assessment and Plan / UC Course  I have reviewed the triage vital signs and the nursing notes.  Pertinent labs & imaging results that were available during my care of the patient were reviewed by me and considered in my medical decision making (see chart for details).   Patient is a nontoxic-appearing 89 year old female presenting for evaluation of 1 week worth of respiratory symptoms and 3 weeks worth of diarrhea as outlined in HPI above.  Patient has a history of GERD as well as abdominal pain, nausea, vomiting, diarrhea.  She reports she has a diarrhea stool every time she eats but no blood in the stool and no nausea or vomiting at this point.  Of more pressing concern is her respiratory symptoms that been going on for the past week.  These include runny nose and nasal congestion for clear discharge and a productive cough for yellow sputum with shortness breath and wheezing.  The patient is able to speak in full sentence without dyspnea or tachypnea.  Respiratory rate at triage was 18 with a 97% room air oxygen saturation.  She is afebrile with an oral temp of 98.  Her lung sounds are clear  to auscultation all fields.  However, out of an abundance of precaution, I will obtain a chest x-ray to evaluate for any acute cardiopulmonary pathology.  Chest x-ray independently reviewed and evaluated by  me.  Impression: Lung fields are well aerated without evidence of infiltrate or effusion.  Cardiomediastinal silhouette appears normal.  Patient does have an elevated hemidiaphragm on the right.  Radiology overread is pending. Radiology impression states no active cardiopulmonary disease.  I will discharge patient home with diagnosis of URI with cough and congestion and diarrhea.  I will prescribe Atrovent  nasal spray for nasal congestion and Tessalon  Perles and Promethazine  DM cough syrup for cough and congestion.  I will give her a list of foods that can help alleviate her diarrhea.  If she develops any abdominal pain, fever, or bloody stools she should go to the ER for evaluation.   Final Clinical Impressions(s) / UC Diagnoses   Final diagnoses:  Acute cough  URI with cough and congestion  Diarrhea, unspecified type     Discharge Instructions      Your chest x-ray did not show any evidence of pneumonia.  I do believe you have an upper respiratory tract infection.  Use over-the-counter Tylenol  according to the package instructions as needed for any fever or pain.  I have given you a list of food choices to help you with your diarrhea.  Your diarrhea may be a result of your body trying to rid yourself of the infection which can cause gastrointestinal irritation.  Use the Atrovent  nasal spray, 2 squirts in each nostril every 6 hours, as needed for runny nose and postnasal drip.  Use the Tessalon  Perles every 8 hours during the day.  Take them with a small sip of water.  They may give you some numbness to the base of your tongue or a metallic taste in your mouth, this is normal.  Use the Promethazine  DM cough syrup at bedtime for cough and congestion.  It will make you drowsy so do not take it during the day.  Return for reevaluation or see your primary care provider for any new or worsening symptoms.  If you develop any abdominal pain, vomiting we cannot keep down fluids, fever, or  blood in your stool you need to go to the ER for evaluation.     ED Prescriptions     Medication Sig Dispense Auth. Provider   benzonatate  (TESSALON ) 100 MG capsule Take 2 capsules (200 mg total) by mouth every 8 (eight) hours. 21 capsule Bernardino Ditch, NP   ipratropium (ATROVENT ) 0.06 % nasal spray Place 2 sprays into both nostrils 4 (four) times daily. 15 mL Bernardino Ditch, NP   promethazine -dextromethorphan (PROMETHAZINE -DM) 6.25-15 MG/5ML syrup Take 5 mLs by mouth 4 (four) times daily as needed. 118 mL Bernardino Ditch, NP      PDMP not reviewed this encounter.     [1]  Social History Tobacco Use   Smoking status: Never   Smokeless tobacco: Never  Vaping Use   Vaping status: Never Used  Substance Use Topics   Alcohol use: No   Drug use: No     Bernardino Ditch, NP 10/29/24 1011  "

## 2024-10-29 NOTE — ED Triage Notes (Addendum)
 Patient presents to UC for congestion x 1 week. Diarrhea, dizziness x 15 days. Treating with OTC cold med and antidiarrheal.    Denies fever, SOB.

## 2024-11-19 ENCOUNTER — Other Ambulatory Visit: Payer: Self-pay | Admitting: Family Medicine

## 2024-11-19 DIAGNOSIS — Z780 Asymptomatic menopausal state: Secondary | ICD-10-CM
